# Patient Record
Sex: Male | Born: 1975 | Race: Black or African American | Hispanic: No | Marital: Single | Smoking: Current every day smoker
Health system: Southern US, Community
[De-identification: ages and names within clinical notes are randomized; demographics above are authoritative.]

## PROBLEM LIST (undated history)

## (undated) DIAGNOSIS — R011 Cardiac murmur, unspecified: Secondary | ICD-10-CM

## (undated) HISTORY — PX: OTHER SURGICAL HISTORY: SHX169

---

## 2000-01-12 ENCOUNTER — Emergency Department (HOSPITAL_COMMUNITY): Admission: EM | Admit: 2000-01-12 | Discharge: 2000-01-12 | Payer: Self-pay | Admitting: Emergency Medicine

## 2000-02-17 ENCOUNTER — Emergency Department (HOSPITAL_COMMUNITY): Admission: EM | Admit: 2000-02-17 | Discharge: 2000-02-17 | Payer: Self-pay | Admitting: Emergency Medicine

## 2000-03-23 ENCOUNTER — Emergency Department (HOSPITAL_COMMUNITY): Admission: EM | Admit: 2000-03-23 | Discharge: 2000-03-23 | Payer: Self-pay | Admitting: Emergency Medicine

## 2000-03-23 ENCOUNTER — Encounter: Payer: Self-pay | Admitting: Emergency Medicine

## 2000-09-23 ENCOUNTER — Emergency Department (HOSPITAL_COMMUNITY): Admission: EM | Admit: 2000-09-23 | Discharge: 2000-09-23 | Payer: Self-pay | Admitting: *Deleted

## 2000-09-30 ENCOUNTER — Emergency Department (HOSPITAL_COMMUNITY): Admission: EM | Admit: 2000-09-30 | Discharge: 2000-09-30 | Payer: Self-pay | Admitting: Emergency Medicine

## 2002-09-16 ENCOUNTER — Emergency Department (HOSPITAL_COMMUNITY): Admission: EM | Admit: 2002-09-16 | Discharge: 2002-09-16 | Payer: Self-pay | Admitting: Emergency Medicine

## 2002-09-16 ENCOUNTER — Encounter: Payer: Self-pay | Admitting: Emergency Medicine

## 2003-02-06 ENCOUNTER — Ambulatory Visit (HOSPITAL_BASED_OUTPATIENT_CLINIC_OR_DEPARTMENT_OTHER): Admission: RE | Admit: 2003-02-06 | Discharge: 2003-02-06 | Payer: Self-pay | Admitting: Orthopedic Surgery

## 2004-06-30 ENCOUNTER — Inpatient Hospital Stay (HOSPITAL_COMMUNITY): Admission: EM | Admit: 2004-06-30 | Discharge: 2004-07-02 | Payer: Self-pay | Admitting: Emergency Medicine

## 2004-06-30 ENCOUNTER — Ambulatory Visit: Payer: Self-pay | Admitting: Internal Medicine

## 2004-07-01 ENCOUNTER — Encounter (INDEPENDENT_AMBULATORY_CARE_PROVIDER_SITE_OTHER): Payer: Self-pay | Admitting: *Deleted

## 2004-07-02 ENCOUNTER — Ambulatory Visit: Payer: Self-pay | Admitting: Internal Medicine

## 2004-08-18 ENCOUNTER — Ambulatory Visit (HOSPITAL_COMMUNITY): Admission: RE | Admit: 2004-08-18 | Discharge: 2004-08-18 | Payer: Self-pay | Admitting: Family Medicine

## 2004-08-18 ENCOUNTER — Emergency Department (HOSPITAL_COMMUNITY): Admission: EM | Admit: 2004-08-18 | Discharge: 2004-08-18 | Payer: Self-pay | Admitting: Family Medicine

## 2004-12-14 ENCOUNTER — Emergency Department (HOSPITAL_COMMUNITY): Admission: EM | Admit: 2004-12-14 | Discharge: 2004-12-14 | Payer: Self-pay | Admitting: Family Medicine

## 2009-12-14 ENCOUNTER — Emergency Department (HOSPITAL_COMMUNITY): Admission: EM | Admit: 2009-12-14 | Discharge: 2009-12-14 | Payer: Self-pay | Admitting: Emergency Medicine

## 2010-06-11 NOTE — Procedures (Signed)
CONCLUSION:  This is a normal EEG awake and asleep.       UJ:[id number]  D:  07/01/2004 17:30:24  T:  07/01/2004 22:59:47  Job #:  [id number]   cc:   Ileana Roup, M.D.  [address], [place] [address]  Fax: [phone]
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
## 2014-10-11 ENCOUNTER — Emergency Department (HOSPITAL_COMMUNITY)
Admission: EM | Admit: 2014-10-11 | Discharge: 2014-10-11 | Disposition: A | Discharge status: Home / Self Care | Payer: Self-pay | Attending: Emergency Medicine | Admitting: Emergency Medicine

## 2014-10-11 ENCOUNTER — Encounter (HOSPITAL_COMMUNITY): Payer: Self-pay | Admitting: Nurse Practitioner

## 2014-10-11 DIAGNOSIS — W1830XA Fall on same level, unspecified, initial encounter: Secondary | ICD-10-CM | POA: Insufficient documentation

## 2014-10-11 DIAGNOSIS — Y999 Unspecified external cause status: Secondary | ICD-10-CM | POA: Insufficient documentation

## 2014-10-11 DIAGNOSIS — M436 Torticollis: Secondary | ICD-10-CM | POA: Insufficient documentation

## 2014-10-11 DIAGNOSIS — S4991XA Unspecified injury of right shoulder and upper arm, initial encounter: Secondary | ICD-10-CM | POA: Insufficient documentation

## 2014-10-11 DIAGNOSIS — Z720 Tobacco use: Secondary | ICD-10-CM | POA: Insufficient documentation

## 2014-10-11 DIAGNOSIS — Y9367 Activity, basketball: Secondary | ICD-10-CM | POA: Insufficient documentation

## 2014-10-11 DIAGNOSIS — Y92310 Basketball court as the place of occurrence of the external cause: Secondary | ICD-10-CM | POA: Insufficient documentation

## 2014-10-11 MED ORDER — NAPROXEN 500 MG PO TABS: 500.0000 mg | ORAL_TABLET | Freq: Two times a day (BID) | ORAL | Status: DC

## 2014-10-11 MED ORDER — METHOCARBAMOL 500 MG PO TABS: 1000.0000 mg | ORAL_TABLET | Freq: Four times a day (QID) | ORAL | Status: DC

## 2014-10-11 NOTE — ED Notes (Signed)
He fell onto ground while playing basketball on Tuesday. Hes had R sided neck and R shoulder pain, increasingly worse since. 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 Cms intact

## 2014-10-11 NOTE — Discharge Instructions (Signed)
Please read and follow all provided instructions.  Your diagnoses today include:  1. Torticollis, acute    Tests performed today include:  Vital signs. See below for your results today.   Medications prescribed:   Robaxin (methocarbamol) - muscle relaxer medication  DO NOT drive or perform any activities that require you to be awake and alert because this medicine can make you drowsy.    Naproxen - anti-inflammatory pain medication  Do not exceed  naproxen every 12 hours, take with food  You have been prescribed an anti-inflammatory medication or NSAID. Take with food. Take smallest effective dose for the shortest duration needed for your pain. Stop taking if you experience stomach pain or vomiting.   Take any prescribed medications only as directed.  Home care instructions:  Follow any educational materials contained in this packet.  BE VERY CAREFUL not to take multiple medicines containing Tylenol (also called acetaminophen). Doing so can lead to an overdose which can damage your liver and cause liver failure and possibly death.   Follow-up instructions: Please follow-up with your primary care provider in the next 3 days for further evaluation of your symptoms.   Return instructions:   Please return to the Emergency Department if you experience worsening symptoms.   Please return if you have any other emergent concerns.  Additional Information:  Your vital signs today were: BP 138/84 mmHg   Pulse 89   Temp(Src) 98.1 F (36.7 C) (Oral)   Resp 16   Ht  (1.88 m)   Wt 179 lb 4.8 oz (81.33 kg)   BMI 23.01 kg/m2   SpO2 98% If your blood pressure (BP) was elevated above 135/85 this visit, please have this repeated by your doctor within one month. --------------

## 2014-10-11 NOTE — ED Provider Notes (Signed)
CSN: [id number]     Arrival date & time 10/11/14  1148 History  This chart was scribed for Rhea Bleacher, working with Marily Memos, MD by Chestine Spore, ED Scribe. The patient was seen in room TR05C/TR05C at 12:06 PM.    Chief Complaint  Patient presents with  . Shoulder Pain     The history is provided by the patient. No language interpreter was used.    Gavin Bowen is a 39 y.o. male who presents to the Emergency Department complaining of worsening right neck pain onset 5 days. He notes that he was playing basketball 5 days ago when he fell onto the ground on his right side and had right sided neck pain initially. 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 He notes that he has not tried any medications for the relief of his symptoms. He denies color change, wound, rash, numbness, tingling, bowel/bladder incontinence, difficulty urinating, leg pain, gait problem, and any other symptoms.    History reviewed. No pertinent past medical history. History reviewed. No pertinent past surgical history. History reviewed. No pertinent family history. Social History  Substance Use Topics  . Smoking status: Current Every Day Smoker    Types: Cigarettes  . Smokeless tobacco: None  . Alcohol Use: No    Review of Systems  Gastrointestinal: Negative for diarrhea.       No bowel incontinence  Genitourinary: Negative for difficulty urinating.       No bladder incontinence  Musculoskeletal: Positive for neck pain. Negative for arthralgias and gait problem.  Skin: Negative for color change, rash and wound.  Neurological: Negative for weakness and numbness.       No tingling      Allergies  Review of patient's allergies indicates no known allergies.  Home Medications   Prior to Admission medications   Not on File   BP 138/84 mmHg  Pulse 89  Temp(Src) 98.1 F (36.7 C) (Oral)  Resp 16  Ht  (1.88 m)  Wt 179 lb 4.8 oz  (81.33 kg)  BMI 23.01 kg/m2  SpO2 98% Physical Exam  Constitutional: He appears well-developed and well-nourished.  HENT:  Head: Normocephalic and atraumatic.  Eyes: Conjunctivae are normal.  Neck: Neck supple. Muscular tenderness present. Decreased range of motion (rotation to right decreased) present.    Pulmonary/Chest: No respiratory distress.  Musculoskeletal:       Right shoulder: Normal. He exhibits normal range of motion, no tenderness and no bony tenderness.       Left shoulder: Normal.       Cervical back: He exhibits tenderness and spasm. He exhibits normal range of motion and no bony tenderness.       Back:  Neurological: He is alert.  Skin: Skin is warm and dry.  Psychiatric: He has a normal mood and affect.  Nursing note and vitals reviewed.   ED Course  Procedures (including critical care time) DIAGNOSTIC STUDIES: Oxygen Saturation is 98% on RA, nl by my interpretation.    COORDINATION OF CARE: 12:10 PM Discussed treatment plan with pt at bedside which includes aleve Rx, muscle relaxer Rx, rest, warm compresses, and pt agreed to plan.   Vital signs reviewed and are as follows: Filed Vitals:   10/11/14 1153  BP: 138/84  Pulse: 89  Temp: 98.1 F (36.7 C)  Resp: 16   Patient counseled on proper use of muscle relaxant medication.  They were told not to  drink alcohol, drive any vehicle, or do any dangerous activities while taking this medication.  Patient verbalized understanding.   MDM   Final diagnoses:  Torticollis, acute   Patient with trapezius spasm and torticollis after fall several days ago. Patient has full range of motion in shoulder and his right upper extremity. No neurological deficits in any extremity.  I personally performed the services described in this documentation, which was scribed in my presence. The recorded information has been reviewed and is accurate.    Renne Crigler, PA-C 10/11/14 1250  Marily Memos, MD 10/12/14 [phone]

## 2015-03-26 ENCOUNTER — Emergency Department (HOSPITAL_COMMUNITY)
Admission: EM | Admit: 2015-03-26 | Discharge: 2015-03-26 | Disposition: A | Discharge status: Home / Self Care | Payer: 59 | Attending: Emergency Medicine | Admitting: Emergency Medicine

## 2015-03-26 ENCOUNTER — Encounter (HOSPITAL_COMMUNITY): Payer: Self-pay | Admitting: Emergency Medicine

## 2015-03-26 DIAGNOSIS — S40861A Insect bite (nonvenomous) of right upper arm, initial encounter: Secondary | ICD-10-CM | POA: Diagnosis not present

## 2015-03-26 DIAGNOSIS — Y9389 Activity, other specified: Secondary | ICD-10-CM | POA: Insufficient documentation

## 2015-03-26 DIAGNOSIS — Z79899 Other long term (current) drug therapy: Secondary | ICD-10-CM | POA: Diagnosis not present

## 2015-03-26 DIAGNOSIS — Y998 Other external cause status: Secondary | ICD-10-CM | POA: Insufficient documentation

## 2015-03-26 DIAGNOSIS — Y9289 Other specified places as the place of occurrence of the external cause: Secondary | ICD-10-CM | POA: Diagnosis not present

## 2015-03-26 DIAGNOSIS — F17210 Nicotine dependence, cigarettes, uncomplicated: Secondary | ICD-10-CM | POA: Diagnosis not present

## 2015-03-26 DIAGNOSIS — S90561A Insect bite (nonvenomous), right ankle, initial encounter: Secondary | ICD-10-CM | POA: Insufficient documentation

## 2015-03-26 DIAGNOSIS — Z791 Long term (current) use of non-steroidal anti-inflammatories (NSAID): Secondary | ICD-10-CM | POA: Diagnosis not present

## 2015-03-26 DIAGNOSIS — W57XXXA Bitten or stung by nonvenomous insect and other nonvenomous arthropods, initial encounter: Secondary | ICD-10-CM | POA: Diagnosis not present

## 2015-03-26 MED ORDER — DIPHENHYDRAMINE HCL 25 MG PO TABS: 25.0000 mg | ORAL_TABLET | Freq: Four times a day (QID) | ORAL | Status: DC

## 2015-03-26 MED ORDER — RANITIDINE HCL 150 MG PO CAPS: 150.0000 mg | ORAL_CAPSULE | Freq: Every day | ORAL | Status: DC

## 2015-03-26 MED ORDER — HYDROCORTISONE 1 % EX CREA: TOPICAL_CREAM | CUTANEOUS | Status: DC

## 2015-03-26 NOTE — ED Provider Notes (Signed)
CSN: [id number]     Arrival date & time 03/26/15  2006 History  By signing my name below, I, Gavin Bowen, attest that this documentation has been prepared under the direction and in the presence of Gavin Mills, PA-C. Electronically Signed: Randell Bowen, ED Scribe. 03/26/2015. 9:15 PM.   Chief Complaint  Bowen presents with  . Insect Bite   The history is provided by the Bowen. No language interpreter was used.   HPI Comments: Gavin Bowen is a 40 y.o. male who presents to the Emergency Department complaining of sore, pruritic, bumps to his right ankle and right arm onset 2 days ago. Bowen reports that he fell asleep on a couch in a house with roaches with gradual onset of itching the following morning and gradual swelling bumps while at work yesterday. He has not tried any treatments. He denies ankle pain, cough, nausea, vomiting, abdominal pain, and fever. No other medical complaints  History reviewed. No pertinent past medical history. History reviewed. No pertinent past surgical history. History reviewed. No pertinent family history. Social History  Substance Use Topics  . Smoking status: Current Every Day Smoker    Types: Cigarettes  . Smokeless tobacco: None  . Alcohol Use: No    Review of Systems A complete 10 system review of systems was obtained and all systems are negative except as noted in the HPI and PMH.    Allergies  Review of Bowen's allergies indicates no known allergies.  Home Medications   Prior to Admission medications   Medication Sig Start Date End Date Taking? Authorizing Provider  diphenhydrAMINE (BENADRYL) 25 MG tablet Take 1 tablet (25 mg total) by mouth every 6 (six) hours. 03/26/15   Gavin Peek, PA-C  hydrocortisone cream 1 % Apply to affected area 2 times daily 03/26/15   Gavin Peek, PA-C  methocarbamol (ROBAXIN) 500 MG tablet Take 2 tablets (1,000 mg total) by mouth 4 (four) times daily. 10/11/14   Gavin Crigler,  PA-C  naproxen (NAPROSYN) 500 MG tablet Take 1 tablet (500 mg total) by mouth 2 (two) times daily. 10/11/14   Gavin Crigler, PA-C  ranitidine (ZANTAC) 150 MG capsule Take 1 capsule (150 mg total) by mouth daily. 03/26/15   Gavin Doucet, PA-C   BP 152/90 mmHg  Pulse 89  Temp(Src) 97.9 F (36.6 C) (Oral)  Resp 18  SpO2 98% Physical Exam  Constitutional: He is oriented to person, place, and time. He appears well-developed and well-nourished. No distress.  HENT:  Head: Normocephalic and atraumatic.  Eyes: Conjunctivae and EOM are normal.  Neck: Neck supple. No tracheal deviation present.  Cardiovascular: Normal rate, regular rhythm and normal heart sounds.   Heart sounds nml. RR/HR.  Pulmonary/Chest: Effort normal. No respiratory distress.  Abdominal: Soft. There is no tenderness.  Abdomen soft and nontender.  Musculoskeletal: Normal range of motion.  Neurological: He is alert and oriented to person, place, and time.  Skin: Skin is warm and dry. Rash noted. Rash is macular and vesicular.  Multiple small erythematous maculopapular lesions over her ankles and upper extremities with one vesicular lesion over the anterior right ankle. No overt warmth.  Psychiatric: He has a normal mood and affect. His behavior is normal.  Nursing note and vitals reviewed.   ED Course  Procedures   DIAGNOSTIC STUDIES: Oxygen Saturation is 98% on RA, normal by my interpretation.    COORDINATION OF CARE: 8:55 PM Will prescribe hydrocortisone cream, Benadryl, and Zantac. Discussed treatment plan with pt at bedside and pt  agreed to plan.   Labs Review Labs Reviewed - No data to display  Imaging Review No results found. I have personally reviewed and evaluated these images and lab results as part of my medical decision-making.   EKG Interpretation None      MDM  Bowen with rash consistent with exposure to bed bugs. Not consistent with cellulitis. No signs of anaphylactic reaction; no new  medications. Will treat with hydrocodone cream, Benadryl and Zantac,. Follow up with PCP in 2-3 days. Return precautions discussed. Pt is safe for discharge at this time.  Final diagnoses:  Insect bite   Filed Vitals:   03/26/15 2043  BP: 152/90  Pulse: 89  Temp: 97.9 F (36.6 C)  TempSrc: Oral  Resp: 18  SpO2: 98%   Meds given in ED:  Medications - No data to display  Discharge Medication List as of 03/26/2015  9:08 PM    START taking these medications   Details  diphenhydrAMINE (BENADRYL) 25 MG tablet Take 1 tablet (25 mg total) by mouth every 6 (six) hours., Starting 03/26/2015, Until Discontinued, Print    hydrocortisone cream 1 % Apply to affected area 2 times daily, Print    ranitidine (ZANTAC) 150 MG capsule Take 1 capsule (150 mg total) by mouth daily., Starting 03/26/2015, Until Discontinued, Print          I personally performed the services described in this documentation, which was scribed in my presence. The recorded information has been reviewed and is accurate.    Gavin Peek, PA-C 03/26/15 2119  Gavin Sou, MD 03/27/15 [phone]

## 2015-03-26 NOTE — Discharge Instructions (Signed)
Taking medications as prescribed. Follow-up with your doctor as needed. Return to ED for new or worsening symptoms. Follow instructions on attached information sheet to help remove bugs from your living area. If the bugs are not removed, your symptoms will persist.

## 2015-03-26 NOTE — ED Notes (Signed)
Pt states about 2 days ago noticed bits to right ankle. Pt does not know what he was bite by. Pt has multiple red bites to ankle and one has a blister.

## 2015-07-18 ENCOUNTER — Encounter (HOSPITAL_COMMUNITY): Payer: Self-pay

## 2015-07-18 ENCOUNTER — Other Ambulatory Visit: Payer: Self-pay

## 2015-07-18 ENCOUNTER — Emergency Department (HOSPITAL_COMMUNITY)
Admission: EM | Admit: 2015-07-18 | Discharge: 2015-07-18 | Disposition: A | Discharge status: Home / Self Care | Payer: 59 | Attending: Emergency Medicine | Admitting: Emergency Medicine

## 2015-07-18 DIAGNOSIS — R443 Hallucinations, unspecified: Secondary | ICD-10-CM | POA: Diagnosis present

## 2015-07-18 DIAGNOSIS — F17210 Nicotine dependence, cigarettes, uncomplicated: Secondary | ICD-10-CM | POA: Insufficient documentation

## 2015-07-18 DIAGNOSIS — R000 Tachycardia, unspecified: Secondary | ICD-10-CM

## 2015-07-18 DIAGNOSIS — Z79899 Other long term (current) drug therapy: Secondary | ICD-10-CM | POA: Diagnosis not present

## 2015-07-18 LAB — CBC
HCT: 40.3 % (ref 39.0–52.0)
Hemoglobin: 14.8 g/dL (ref 13.0–17.0)
MCH: 31.4 pg (ref 26.0–34.0)
MCHC: 36.7 g/dL — ABNORMAL HIGH (ref 30.0–36.0)
MCV: 85.6 fL (ref 78.0–100.0)
Platelets: 314 K/uL (ref 150–400)
RBC: 4.71 MIL/uL (ref 4.22–5.81)
RDW: 12.7 % (ref 11.5–15.5)
WBC: 14.3 K/uL — ABNORMAL HIGH (ref 4.0–10.5)

## 2015-07-18 LAB — ETHANOL: Alcohol, Ethyl (B): <5 mg/dL

## 2015-07-18 LAB — COMPREHENSIVE METABOLIC PANEL
ALT: 30 U/L (ref 17–63)
ANION GAP: 9 (ref 5–15)
AST: 28 U/L (ref 15–41)
Albumin: 4.3 g/dL (ref 3.5–5.0)
Alkaline Phosphatase: 63 U/L (ref 38–126)
BILIRUBIN TOTAL: 1.4 mg/dL — AB (ref 0.3–1.2)
BUN: 6 mg/dL (ref 6–20)
CO2: 27 mmol/L (ref 22–32)
Calcium: 9.8 mg/dL (ref 8.9–10.3)
Chloride: 101 mmol/L (ref 101–111)
Creatinine, Ser: 1.13 mg/dL (ref 0.61–1.24)
Glucose, Bld: 140 mg/dL — ABNORMAL HIGH (ref 65–99)
POTASSIUM: 3.1 mmol/L — AB (ref 3.5–5.1)
Sodium: 137 mmol/L (ref 135–145)
TOTAL PROTEIN: 8.1 g/dL (ref 6.5–8.1)

## 2015-07-18 LAB — RAPID URINE DRUG SCREEN, HOSP PERFORMED
Amphetamines: NOT DETECTED
Barbiturates: NOT DETECTED
Benzodiazepines: NOT DETECTED
Cocaine: POSITIVE — AB
Opiates: POSITIVE — AB
Tetrahydrocannabinol: POSITIVE — AB

## 2015-07-18 MED ORDER — LORAZEPAM 2 MG/ML IJ SOLN: 1.0000 mg | Freq: Once | INTRAMUSCULAR | Status: DC

## 2015-07-18 MED ORDER — SODIUM CHLORIDE 0.9 % IV BOLUS (SEPSIS): 2000.0000 mL | Freq: Once | INTRAVENOUS | Status: DC

## 2015-07-18 NOTE — ED Notes (Signed)
Pt states that he does not want any visitors at this time.

## 2015-07-18 NOTE — Discharge Instructions (Signed)
RETURN HERE IF YOU START HAVING ANY SYMPTOMS THAT ARE CONCERNING. PUSH FLUIDS, GET PLENTY OF REST, MAINTAIN A HEALTHY DIET.

## 2015-07-18 NOTE — ED Notes (Signed)
Pt admits to using "molly" also

## 2015-07-18 NOTE — ED Provider Notes (Signed)
CSN: [id number]     Arrival date & time 07/18/15  2109 History   First MD Initiated Contact with Patient 07/18/15 2209     Chief Complaint  Patient presents with  . Hallucinations  . Anxiety     (Consider location/radiation/quality/duration/timing/severity/associated sxs/prior Treatment) HPI Comments: Patient with no significant medical history presents with complaint of visual and tactile hallucinations since this morning. He reports being out with friends last night to celebrate his birthday and admits to smoking marijuana, drinking alcohol, and doing "molly". Last use this morning. Since shortly after smoking this morning he reports bugs crawling on his skin that he could feel and see. This has persisted throughout the day. No vomiting, pain, SOB, fever, sweating.   The history is provided by the patient. No language interpreter was used.    History reviewed. No pertinent past medical history. History reviewed. No pertinent past surgical history. No family history on file. Social History  Substance Use Topics  . Smoking status: Current Every Day Smoker    Types: Cigarettes  . Smokeless tobacco: None  . Alcohol Use: No    Review of Systems  Constitutional: Negative for fever and chills.  HENT: Negative.   Respiratory: Negative.   Cardiovascular: Negative.   Gastrointestinal: Negative.   Musculoskeletal: Negative.   Skin: Negative.   Neurological: Negative.   Psychiatric/Behavioral: Positive for hallucinations.      Allergies  Review of patient's allergies indicates no known allergies.  Home Medications   Prior to Admission medications   Medication Sig Start Date End Date Taking? Authorizing Provider  diphenhydrAMINE (BENADRYL) 25 MG tablet Take 1 tablet (25 mg total) by mouth every 6 (six) hours. 03/26/15   Joycie PeekBenjamin Cartner, PA-C  hydrocortisone cream 1 % Apply to affected area 2 times daily 03/26/15   Joycie PeekBenjamin Cartner, PA-C  methocarbamol (ROBAXIN) 500 MG tablet Take  2 tablets (1,000 mg total) by mouth 4 (four) times daily. 10/11/14   Renne CriglerJoshua Geiple, PA-C  naproxen (NAPROSYN) 500 MG tablet Take 1 tablet (500 mg total) by mouth 2 (two) times daily. 10/11/14   Renne CriglerJoshua Geiple, PA-C  ranitidine (ZANTAC) 150 MG capsule Take 1 capsule (150 mg total) by mouth daily. 03/26/15   Benjamin Cartner, PA-C   BP 166/104 mmHg  Pulse 111  Temp(Src) 99.3 F (37.4 C) (Oral)  Resp 12  SpO2 99% Physical Exam  Constitutional: He is oriented to person, place, and time. He appears well-developed and well-nourished.  HENT:  Head: Normocephalic.  Persistent jaw grinding.  Neck: Normal range of motion. Neck supple.  Cardiovascular: Regular rhythm.  Tachycardia present.   Pulmonary/Chest: Effort normal and breath sounds normal. He has no wheezes. He has no rales. He exhibits no tenderness.  Abdominal: Soft. Bowel sounds are normal. There is no tenderness. There is no rebound and no guarding.  Musculoskeletal: Normal range of motion.  Neurological: He is alert and oriented to person, place, and time.  Skin: Skin is warm and dry. No rash noted.  No rash, erythema, swelling to skin.  Psychiatric: He has a normal mood and affect.    ED Course  Procedures (including critical care time) Labs Review Labs Reviewed  COMPREHENSIVE METABOLIC PANEL - Abnormal; Notable for the following:    Potassium 3.1 (*)    Glucose, Bld 140 (*)    Total Bilirubin 1.4 (*)    All other components within normal limits  CBC - Abnormal; Notable for the following:    WBC 14.3 (*)    MCHC 36.7 (*)  All other components within normal limits  URINE RAPID DRUG SCREEN, HOSP PERFORMED - Abnormal; Notable for the following:    Opiates POSITIVE (*)    Cocaine POSITIVE (*)    Tetrahydrocannabinol POSITIVE (*)    All other components within normal limits  ETHANOL   Results for orders placed or performed during the hospital encounter of 07/18/15  Comprehensive metabolic panel  Result Value Ref Range    Sodium 137 135 - 145 mmol/L   Potassium 3.1 (L) 3.5 - 5.1 mmol/L   Chloride 101 101 - 111 mmol/L   CO2 27 22 - 32 mmol/L   Glucose, Bld 140 (H) 65 - 99 mg/dL   BUN 6 6 - 20 mg/dL   Creatinine, Ser 0.451.13 0.61 - 1.24 mg/dL   Calcium 9.8 8.9 - 40.910.3 mg/dL   Total Protein 8.1 6.5 - 8.1 g/dL   Albumin 4.3 3.5 - 5.0 g/dL   AST 28 15 - 41 U/L   ALT 30 17 - 63 U/L   Alkaline Phosphatase 63 38 - 126 U/L   Total Bilirubin 1.4 (H) 0.3 - 1.2 mg/dL   GFR calc non Af Amer >60 >60 mL/min   GFR calc Af Amer >60 >60 mL/min   Anion gap 9 5 - 15  Ethanol  Result Value Ref Range   Alcohol, Ethyl (B) <5 <5 mg/dL  cbc  Result Value Ref Range   WBC 14.3 (H) 4.0 - 10.5 K/uL   RBC 4.71 4.22 - 5.81 MIL/uL   Hemoglobin 14.8 13.0 - 17.0 g/dL   HCT 81.140.3 91.439.0 - 78.252.0 %   MCV 85.6 78.0 - 100.0 fL   MCH 31.4 26.0 - 34.0 pg   MCHC 36.7 (H) 30.0 - 36.0 g/dL   RDW 95.612.7 21.311.5 - 08.615.5 %   Platelets 314 150 - 400 K/uL  Rapid urine drug screen (hospital performed)  Result Value Ref Range   Opiates POSITIVE (A) NONE DETECTED   Cocaine POSITIVE (A) NONE DETECTED   Benzodiazepines NONE DETECTED NONE DETECTED   Amphetamines NONE DETECTED NONE DETECTED   Tetrahydrocannabinol POSITIVE (A) NONE DETECTED   Barbiturates NONE DETECTED NONE DETECTED    Imaging Review No results found. I have personally reviewed and evaluated these images and lab results as part of my medical decision-making.   EKG Interpretation   Date/Time:  Saturday July 18 2015 22:07:56 EDT Ventricular Rate:  105 PR Interval:    QRS Duration: 136 QT Interval:  370 QTC Calculation: 489 R Axis:   64 Text Interpretation:  Sinus tachycardia Left bundle branch block rapid  rate but no significant change from 2006 Confirmed by ZACKOWSKI  MD, SCOTT  [phone]([id number]) on 07/18/2015 10:12:44 PM      MDM   Final diagnoses:  None    1. Tachycardia 2. Tactile hallucinations  Patient presents with visual and tactile hallucinations ("bugs crawling on my  skin") starting after using multiple substances during his birthday celebration which ended this morning. IVF's ordered with ATivan. No SI/HI.  On IV start nurse notes tachycardia resolved. Patient re-evaluated. He is feeling much better. VSS. He is no longer hallucinating. He states he is comfortable going home. Discussed return precautions.     Elpidio AnisShari Alano Blasco, PA-C 07/18/15 2316  Vanetta MuldersScott Zackowski, MD 07/22/15 [phone]

## 2015-07-18 NOTE — ED Notes (Addendum)
Patient complains of using marijuana, etoh last night at party and since that time has been seeing bugs crawling on him, appears anxious. Denies pain. Alert to person, place, time.

## 2015-07-20 ENCOUNTER — Ambulatory Visit (INDEPENDENT_AMBULATORY_CARE_PROVIDER_SITE_OTHER): Payer: 59 | Admitting: Family Medicine

## 2015-07-20 ENCOUNTER — Ambulatory Visit (INDEPENDENT_AMBULATORY_CARE_PROVIDER_SITE_OTHER): Payer: 59

## 2015-07-20 VITALS — BP 126/82 | HR 90 | Temp 97.7°F | Resp 16 | Ht 74.0 in | Wt 202.8 lb

## 2015-07-20 DIAGNOSIS — M79672 Pain in left foot: Secondary | ICD-10-CM | POA: Diagnosis not present

## 2015-07-20 DIAGNOSIS — S9032XA Contusion of left foot, initial encounter: Secondary | ICD-10-CM | POA: Diagnosis not present

## 2015-07-20 DIAGNOSIS — Y990 Civilian activity done for income or pay: Secondary | ICD-10-CM | POA: Diagnosis not present

## 2015-07-20 DIAGNOSIS — S63502A Unspecified sprain of left wrist, initial encounter: Secondary | ICD-10-CM

## 2015-07-20 NOTE — Progress Notes (Signed)
Patient ID: Gavin Bowen, male    DOB: 11/20/1975  Age: 40 y.o. MRN: [id number]  Chief Complaint  Patient presents with  . Foot Injury    tank rolled over his foot, hard to walk on it, x friday  . 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 He was rolling a barrel to the garage on Friday and lost control a little bit, tried to return it to stop it and it rolled over his left foot. In the process somehow he also strained his left wrist. It was at the end of the shift and he was wanting to get off because it was his birthday weekend, so he did not report it at that time. It continued to hurt a lot through the weekend and this morning he could not walk on the left foot because of the pain on the outer side of the foot so he came on in here. He will need a work excuse for today.  Current allergies, medications, problem list, past/family and social histories reviewed.  Objective:  BP 126/82 mmHg  Pulse 90  Temp(Src) 97.7 F (36.5 C) (Oral)  Resp 16  Ht 6\' 2"  (1.88 m)  Wt 202 lb 12.8 oz (91.989 kg)  BMI 26.03 kg/m2  SpO2 99%  Healthy-appearing young man. Left wrist is not swollen or bruised. It is not tender but he has pain on full flexion of the wrist.  The left foot also does not appear deformed or ecchymotic. He is nontender in the ankle. There is tenderness over the fourth metatarsal region of the foot. The second, third, and fifth metatarsals do not seem tender. Motion of the fourth toe also causes pain. The tenderness is from midfoot down.  Assessment & Plan:   Assessment: 1. Work related injury   2. Left foot pain   3. Foot contusion, left, initial encounter   4. Left wrist sprain, initial encounter       Plan: Suspicious for fracture of the fourth metatarsal, will get x-ray. The wrist is just mildly sprained and I don't think that will be a big issue. It does not seem to require x-rays at this time.  Orders  Placed This Encounter  Procedures  . DG Foot Complete Left    Order Specific Question:  Reason for Exam (SYMPTOM  OR DIAGNOSIS REQUIRED)    Answer:  pain from work injury    Order Specific Question:  Preferred imaging location?    Answer:  External    No orders of the defined types were placed in this encounter.   Radiologist does not see any fracture in the area of concern. However I note that at the distal fourth metatarsal there is a faintly visible line on a single view. That is exactly where he is painful, and although not enough to call a fracture it is possible he has an early hairline deformity.      Patient Instructions   Minimize weightbearing on the left foot. Wear the firm sole and use crutches.  If the job place has a light duty you can qualify for, I can change my instructions. In the meanwhile I am stating that you should be off work for the next week and return for recheck and re-x-ray.  The wrist should be fine. I do not believe further evaluation and treatment is needed.  Ibuprofen over-the-counter 600-800 mg 3 times daily as needed for pain  Applying ice for about 15 minutes for 5 times a day can give some relief also.    IF you received an x-ray today, you will receive an invoice [facility]. Please contact [facility] at [phone] with questions or concerns regarding your invoice.   IF you received labwork today, you will receive an invoice from [facility]. Please contact [facility] at [phone] with questions or concerns regarding your invoice.   Our billing staff will not be able to assist you with questions regarding bills from these companies.  You will be contacted with the lab results as soon as they are available. The fastest way to get your results is to activate your My Chart account. Instructions are located on the last page of this paperwork. If you have not heard from us regarding the results  in 2 weeks, please contact this office.          Return in about 1 week (around 07/27/2015).   Thad Osoria, MD 07/20/2015

## 2015-07-20 NOTE — Patient Instructions (Addendum)
Minimize weightbearing on the left foot. Wear the firm sole and use crutches.  If the job place has a light duty you can qualify for, I can change my instructions. In the meanwhile I am stating that you should be off work for the next week and return for recheck and re-x-ray.  The wrist should be fine. I do not believe further evaluation and treatment is needed.  Ibuprofen over-the-counter 600-800 mg 3 times daily as needed for pain  Applying ice for about 15 minutes for 5 times a day can give some relief also.    IF you received an x-ray today, you will receive an invoice from [facility]. Please contact [facility] at [phone] with questions or concerns regarding your invoice.   IF you received labwork today, you will receive an invoice from [facility]. Please contact [facility] at [phone] with questions or concerns regarding your invoice.   Our billing staff will not be able to assist you with questions regarding bills from these companies.  You will be contacted with the lab results as soon as they are available. The fastest way to get your results is to activate your My Chart account. Instructions are located on the last page of this paperwork. If you have not heard from us regarding the results in 2 weeks, please contact this office.

## 2015-07-27 ENCOUNTER — Ambulatory Visit (INDEPENDENT_AMBULATORY_CARE_PROVIDER_SITE_OTHER): Payer: 59 | Admitting: Family Medicine

## 2015-07-27 ENCOUNTER — Ambulatory Visit (INDEPENDENT_AMBULATORY_CARE_PROVIDER_SITE_OTHER): Payer: 59

## 2015-07-27 VITALS — BP 138/90 | HR 86 | Temp 98.9°F | Resp 16 | Ht 74.0 in | Wt 200.0 lb

## 2015-07-27 DIAGNOSIS — S99922D Unspecified injury of left foot, subsequent encounter: Secondary | ICD-10-CM

## 2015-07-27 DIAGNOSIS — S9032XD Contusion of left foot, subsequent encounter: Secondary | ICD-10-CM | POA: Diagnosis not present

## 2015-07-27 DIAGNOSIS — S63502D Unspecified sprain of left wrist, subsequent encounter: Secondary | ICD-10-CM

## 2015-07-27 NOTE — Progress Notes (Signed)
Subjective:    Patient ID: Gavin Bowen, male    DOB: 04-21-1975, 40 y.o.   MRN: [id number] By signing my name below, I, Gavin Bowen, attest that this documentation has been prepared under the direction and in the presence of Gavin SorensonEva Carin Shipp, MD. Electronically Signed: Javier Dockerobert Ryan Bowen, ER Scribe. 07/27/2015. 6:33 PM.  Chief Complaint  Patient presents with  . Follow-up    w/c. Pt wants referral to orthopedic.     HPI HPI Comments: Gavin Bowen is a 40 y.o. male who [facility] for a follow up appointment from an injury on 07/17/2015. He was initially by my partner on 07/20/2015 after a large paint can rolled over his foot which left him unable to walk. He states today his foot is not any better, and he continues to be unable to walk on it.  He requests to be referred to orthopedics since his foot xray was unrevealing other than a slight possibility of an early hairline deformity at his distal 4th metatarsal and his sxs have not improved with management of post-op shoe and recommendation of minimal weight-bearing with crutches. His wrist has hurt him to much to be able to use the crutches.   No past medical history on file.  No Known Allergies  Current Outpatient Prescriptions on File Prior to Visit  Medication Sig Dispense Refill  . methocarbamol (ROBAXIN) 500 MG tablet Take 2 tablets (1,000 mg total) by mouth 4 (four) times daily. (Patient not taking: Reported on 07/20/2015) 30 tablet 0  . ranitidine (ZANTAC) 150 MG capsule Take 1 capsule (150 mg total) by mouth daily. (Patient not taking: Reported on 07/20/2015) 30 capsule 0   No current facility-administered medications on file prior to visit.    Review of Systems  Constitutional: Negative for fever and chills.  Musculoskeletal: Positive for arthralgias and gait problem.  Skin: Negative for wound.  Neurological: Positive for weakness (Secondary to pain). Negative for numbness.      Objective:  BP 138/90  mmHg  Pulse 86  Temp(Src) 98.9 F (37.2 C) (Oral)  Resp 16  Ht 6\' 2"  (1.88 m)  Wt 200 lb (90.719 kg)  BMI 25.67 kg/m2  SpO2 99%  Physical Exam  Constitutional: He is oriented to person, place, and time. He appears well-developed and well-nourished. No distress.  HENT:  Head: Normocephalic and atraumatic.  Eyes: Pupils are equal, round, and reactive to light.  Neck: Neck supple.  Cardiovascular: Normal rate.   Pulmonary/Chest: Effort normal. No respiratory distress.  Musculoskeletal:       Left wrist: He exhibits decreased range of motion. He exhibits no tenderness, no bony tenderness, no swelling, no effusion, no crepitus, no deformity and no laceration.       Left foot: There is decreased range of motion, tenderness and bony tenderness. There is normal capillary refill, no crepitus, no deformity and no laceration.       Feet:  Severely ttp at distal left 3rd and 4th metatarsals Left wrist without bony tenderness including at radial and ulnar prominences and anatomic snuffbox.  Neurological: He is alert and oriented to person, place, and time. Coordination normal.  Skin: Skin is warm and dry. He is not diaphoretic.  Psychiatric: He has a normal mood and affect. His behavior is normal.  Nursing note and vitals reviewed.    Dg Foot Complete Left  07/27/2015  CLINICAL DATA:  Pain following injury EXAM: LEFT FOOT - COMPLETE 3+ VIEW COMPARISON:  July 20, 2015 FINDINGS: Frontal, oblique, and lateral views were obtained. There is a stable focus of ossification immediately medial to the distal aspect of the third distal phalanx. This finding potentially may represent a prior avulsion type injury. There is no acute appearing fracture or dislocation. There is hallux valgus deformity at the first MTP joint with early bunion formation. There is no appreciable joint space narrowing. No erosive change. IMPRESSION: Small area of ossification immediately medial to the distal aspect of the third distal  phalanx, stable. No acute fracture or dislocation evident. Joint spaces appear unremarkable. There is mild hallux valgus deformity with early bunion formation at the first MTP joint, stable. Electronically Signed   By: Bretta BangWilliam  Woodruff III M.D.   On: 07/27/2015 18:51   Dg Foot Complete Left  07/20/2015  CLINICAL DATA:  Left foot injury at work. Left foot pain. Initial encounter. EXAM: LEFT FOOT - COMPLETE 3+ VIEW COMPARISON:  None. FINDINGS: Tiny ossific density seen along the medial aspect of the distal phalanx of the middle toe. This is of indeterminate age and could represent an acute or chronic fracture fragment. No other fractures identified. No evidence of dislocation. No other bone lesions identified. IMPRESSION: Tiny ossific density adjacent to distal phalanx of middle toe. This is of indeterminate age radiographically, and could represent a tiny acute or chronic fracture fragment. Recommend clinical correlation for point tenderness at this site. Electronically Signed   By: Myles RosenthalJohn  Stahl M.D.   On: 07/20/2015 11:52    Assessment & Plan:   1. Foot injury, left, subsequent encounter   2. Wrist sprain, left, subsequent encounter   Worker's comp case from inj on Fri July 17, 2015.  Pt has been wearing post-op shoe on left and using crutches as needed (he brought his crutches to the office today but walked throughout the office just holding them in the air.)  He is still very tender over distal 4th metatarsal where there was a concern for poss early hairline deformity but repeat xray today does not confirm so will refer to ortho for further eval - see if pt needs additional imaging to eval for stress fracture.  Placed in thumb spica brace on left wrist - needs imaging but exam today reassuring for no point tenderness over carpel bones so will defer to ortho.  Recheck in 1 wk unless he has ortho appt by then. Ok to to RTW light duty - needs a position where he can sit for most of the day.  Orders  Placed This Encounter  Procedures  . DG Foot Complete Left    Standing Status: Future     Number of Occurrences: 1     Standing Expiration Date: 07/26/2016    Order Specific Question:  Reason for Exam (SYMPTOM  OR DIAGNOSIS REQUIRED)    Answer:  follow up possible fracture    Order Specific Question:  Preferred imaging location?    Answer:  External     I personally performed the services described in this documentation, which was scribed in my presence. The recorded information has been reviewed and considered, and addended by me as needed.   Gavin SorensonEva Shermaine Brigham, M.D.  [facility]  [place] [address] [place], [place] [address] [phone] phone [phone] fax  08/03/2015 2:38 PM

## 2015-07-27 NOTE — Patient Instructions (Addendum)
   IF you received an x-ray today, you will receive an invoice from [facility]. Please contact [facility] at [phone] with questions or concerns regarding your invoice.   IF you received labwork today, you will receive an invoice from [facility]. Please contact [facility] at [phone] with questions or concerns regarding your invoice.   Our billing staff will not be able to assist you with questions regarding bills from these companies.  You will be contacted with the lab results as soon as they are available. The fastest way to get your results is to activate your My Chart account. Instructions are located on the last page of this paperwork. If you have not heard from us regarding the results in 2 weeks, please contact this office.      Wrist Sprain With Rehab A sprain is an injury in which a ligament that maintains the proper alignment of a joint is partially or completely torn. The ligaments of the wrist are susceptible to sprains. Sprains are classified into three categories. Grade 1 sprains cause pain, but the tendon is not lengthened. Grade 2 sprains include a lengthened ligament because the ligament is stretched or partially ruptured. With grade 2 sprains there is still function, although the function may be diminished. Grade 3 sprains are characterized by a complete tear of the tendon or muscle, and function is usually impaired. SYMPTOMS   Pain tenderness, inflammation, and/or bruising (contusion) of the injury.  A "pop" or tear felt and/or heard at the time of injury.  Decreased wrist function. CAUSES  A wrist sprain occurs when a force is placed on one or more ligaments that is greater than it/they can withstand. Common mechanisms of injury include:  Catching a ball with your hands.  Repetitive and/ or strenuous extension or flexion of the wrist. RISK INCREASES WITH:  Previous wrist injury.  Contact sports (boxing or  wrestling).  Activities in which falling is common.  Poor strength and flexibility.  Improperly fitted or padded protective equipment. PREVENTION  Warm up and stretch properly before activity.  Allow for adequate recovery between workouts.  Maintain physical fitness:  Strength, flexibility, and endurance.  Cardiovascular fitness.  Protect the wrist joint by limiting its motion with the use of taping, braces, or splints.  Protect the wrist after injury for 6 to 12 months. PROGNOSIS  The prognosis for wrist sprains depends on the degree of injury. Grade 1 sprains require 2 to 6 weeks of treatment. Grade 2 sprains require 6 to 8 weeks of treatment, and grade 3 sprains require up to 12 weeks.  RELATED COMPLICATIONS   Prolonged healing time, if improperly treated or re-injured.  Recurrent symptoms that result in a chronic problem.  Injury to nearby structures (bone, cartilage, nerves, or tendons).  Arthritis of the wrist.  Inability to compete in athletics at a high level.  Wrist stiffness or weakness.  Progression to a complete rupture of the ligament. TREATMENT  Treatment initially involves resting from any activities that aggravate the symptoms, and the use of ice and medications to help reduce pain and inflammation. Your caregiver may recommend immobilizing the wrist for a period of time in order to reduce stress on the ligament and allow for healing. After immobilization it is important to perform strengthening and stretching exercises to help regain strength and a full range of motion. These exercises may be completed at home or with a therapist. Surgery is not usually required for wrist sprains, unless the ligament has   been ruptured (grade 3 sprain). MEDICATION   If pain medication is necessary, then nonsteroidal anti-inflammatory medications, such as aspirin and ibuprofen, or other minor pain relievers, such as acetaminophen, are often recommended.  Do not take pain  medication for 7 days before surgery.  Prescription pain relievers may be given if deemed necessary by your caregiver. Use only as directed and only as much as you need. HEAT AND COLD  Cold treatment (icing) relieves pain and reduces inflammation. Cold treatment should be applied for 10 to 15 minutes every 2 to 3 hours for inflammation and pain and immediately after any activity that aggravates your symptoms. Use ice packs or massage the area with a piece of ice (ice massage).  Heat treatment may be used prior to performing the stretching and strengthening activities prescribed by your caregiver, physical therapist, or athletic trainer. Use a heat pack or soak your injury in warm water. SEEK MEDICAL CARE IF:  Treatment seems to offer no benefit, or the condition worsens.  Any medications produce adverse side effects. EXERCISES RANGE OF MOTION (ROM) AND STRETCHING EXERCISES - Wrist Sprain  These exercises may help you when beginning to rehabilitate your injury. Your symptoms may resolve with or without further involvement from your physician, physical therapist or athletic trainer. While completing these exercises, remember:   Restoring tissue flexibility helps normal motion to return to the joints. This allows healthier, less painful movement and activity.  An effective stretch should be held for at least 30 seconds.  A stretch should never be painful. You should only feel a gentle lengthening or release in the stretched tissue. RANGE OF MOTION - Wrist Flexion, Active-Assisted  Extend your right / left elbow with your fingers pointing down.*  Gently pull the back of your hand towards you until you feel a gentle stretch on the top of your forearm.  Hold this position for __________ seconds. Repeat __________ times. Complete this exercise __________ times per day.  *If directed by your physician, physical therapist or athletic trainer, complete this stretch with your elbow bent rather  than extended. RANGE OF MOTION - Wrist Extension, Active-Assisted  Extend your right / left elbow and turn your palm upwards.*  Gently pull your palm/fingertips back so your wrist extends and your fingers point more toward the ground.  You should feel a gentle stretch on the inside of your forearm.  Hold this position for __________ seconds. Repeat __________ times. Complete this exercise __________ times per day. *If directed by your physician, physical therapist or athletic trainer, complete this stretch with your elbow bent, rather than extended. RANGE OF MOTION - Supination, Active  Stand or sit with your elbows at your side. Bend your right / left elbow to 90 degrees.  Turn your palm upward until you feel a gentle stretch on the inside of your forearm.  Hold this position for __________ seconds. Slowly release and return to the starting position. Repeat __________ times. Complete this stretch __________ times per day.  RANGE OF MOTION - Pronation, Active  Stand or sit with your elbows at your side. Bend your right / left elbow to 90 degrees.  Turn your palm downward until you feel a gentle stretch on the top of your forearm.  Hold this position for __________ seconds. Slowly release and return to the starting position. Repeat __________ times. Complete this stretch __________ times per day.  STRETCH - Wrist Flexion  Place the back of your right / left hand on a tabletop leaving your elbow   slightly bent. Your fingers should point away from your body.  Gently press the back of your hand down onto the table by straightening your elbow. You should feel a stretch on the top of your forearm.  Hold this position for __________ seconds. Repeat __________ times. Complete this stretch __________ times per day.  STRETCH - Wrist Extension  Place your right / left fingertips on a tabletop leaving your elbow slightly bent. Your fingers should point backwards.  Gently press your fingers  and palm down onto the table by straightening your elbow. You should feel a stretch on the inside of your forearm.  Hold this position for __________ seconds. Repeat __________ times. Complete this stretch __________ times per day.  STRENGTHENING EXERCISES - Wrist Sprain These exercises may help you when beginning to rehabilitate your injury. They may resolve your symptoms with or without further involvement from your physician, physical therapist or athletic trainer. While completing these exercises, remember:   Muscles can gain both the endurance and the strength needed for everyday activities through controlled exercises.  Complete these exercises as instructed by your physician, physical therapist or athletic trainer. Progress with the resistance and repetition exercises only as your caregiver advises. STRENGTH - Wrist Flexors  Sit with your right / left forearm palm-up and fully supported. Your elbow should be resting below the height of your shoulder. Allow your wrist to extend over the edge of the surface.  Loosely holding a __________ weight or a piece of rubber exercise band/tubing, slowly curl your hand up toward your forearm.  Hold this position for __________ seconds. Slowly lower the wrist back to the starting position in a controlled manner. Repeat __________ times. Complete this exercise __________ times per day.  STRENGTH - Wrist Extensors  Sit with your right / left forearm palm-down and fully supported. Your elbow should be resting below the height of your shoulder. Allow your wrist to extend over the edge of the surface.  Loosely holding a __________ weight or a piece of rubber exercise band/tubing, slowly curl your hand up toward your forearm.  Hold this position for __________ seconds. Slowly lower the wrist back to the starting position in a controlled manner. Repeat __________ times. Complete this exercise __________ times per day.  STRENGTH - Ulnar Deviators  Stand  with a ____________________ weight in your right / left hand, or sit holding on to the rubber exercise band/tubing with your opposite arm supported.  Move your wrist so that your pinkie travels toward your forearm and your thumb moves away from your forearm.  Hold this position for __________ seconds and then slowly lower the wrist back to the starting position. Repeat __________ times. Complete this exercise __________ times per day STRENGTH - Radial Deviators  Stand with a ____________________ weight in your  right / left hand, or sit holding on to the rubber exercise band/tubing with your arm supported.  Raise your hand upward in front of you or pull up on the rubber tubing.  Hold this position for __________ seconds and then slowly lower the wrist back to the starting position. Repeat __________ times. Complete this exercise __________ times per day. STRENGTH - Forearm Supinators  Sit with your right / left forearm supported on a table, keeping your elbow below shoulder height. Rest your hand over the edge, palm down.  Gently grip a hammer or a soup ladle.  Without moving your elbow, slowly turn your palm and hand upward to a "thumbs-up" position.  Hold this position for   __________ seconds. Slowly return to the starting position. Repeat __________ times. Complete this exercise __________ times per day.  STRENGTH - Forearm Pronators  Sit with your right / left forearm supported on a table, keeping your elbow below shoulder height. Rest your hand over the edge, palm up.  Gently grip a hammer or a soup ladle.  Without moving your elbow, slowly turn your palm and hand upward to a "thumbs-up" position.  Hold this position for __________ seconds. Slowly return to the starting position. Repeat __________ times. Complete this exercise __________ times per day.  STRENGTH - Grip  Grasp a tennis ball, a dense sponge, or a large, rolled sock in your hand.  Squeeze as hard as you can  without increasing any pain.  Hold this position for __________ seconds. Release your grip slowly. Repeat __________ times. Complete this exercise __________ times per day.    This information is not intended to replace advice given to you by your health care provider. Make sure you discuss any questions you have with your health care provider.   Document Released: 01/10/2005 Document Revised: 10/01/2014 Document Reviewed: 04/24/2008 Elsevier Interactive Patient Education 2016 [employer]  

## 2016-03-17 ENCOUNTER — Emergency Department (HOSPITAL_COMMUNITY)
Admission: EM | Admit: 2016-03-17 | Discharge: 2016-03-17 | Disposition: A | Discharge status: Home / Self Care | Payer: 59 | Attending: Emergency Medicine | Admitting: Emergency Medicine

## 2016-03-17 ENCOUNTER — Encounter (HOSPITAL_COMMUNITY): Payer: Self-pay

## 2016-03-17 DIAGNOSIS — F1410 Cocaine abuse, uncomplicated: Secondary | ICD-10-CM

## 2016-03-17 DIAGNOSIS — F17210 Nicotine dependence, cigarettes, uncomplicated: Secondary | ICD-10-CM | POA: Insufficient documentation

## 2016-03-17 DIAGNOSIS — R569 Unspecified convulsions: Secondary | ICD-10-CM | POA: Insufficient documentation

## 2016-03-17 HISTORY — DX: Cardiac murmur, unspecified: R01.1

## 2016-03-17 LAB — CBG MONITORING, ED: GLUCOSE-CAPILLARY: 131 mg/dL — AB (ref 65–99)

## 2016-03-17 NOTE — ED Triage Notes (Signed)
Pt. Coming from home via [facility] for altered mental status after using buccal cocaine at around 1000. Pt. Doesn't remember anything from 1000-1400. Pt. Found by friend unresponsive. EMS arrived and gave patient ~600 mL and pt. Now AOx4. Pt. States he has had a seizure a few years ago, but never seen by MD. EMS reports pt. Seeming post ictal when they arrived. Pt. Also states hx of heart murmur. Pt. Initial BP 107/60 which increased to 145/86 with fluids. Pt. ST at around 110 en route.

## 2016-03-17 NOTE — ED Provider Notes (Signed)
[facility]-EMERGENCY DEPT Provider Note   CSN: [id number] Arrival date & time: 03/17/16  1524     History   Chief Complaint Chief Complaint  Patient presents with  . Altered Mental Status    HPI Gavin Bowen is a 41 y.o. male.  HPI Patient brought to the emergency department after possible seizure.  Found unresponsive after using cocaine.  Initially was postictal and is now returned to baseline mental status at this time.  Currently the patient is without any complaints.  Denies weakness of his arms or legs.  Denies headache.  No neck pain.  He feels much better.  He reports a seizure several years ago but never was seen and evaluated by physician.  He did use cocaine at that time as well.  No tongue biting or urinary incontinence.  Patient feels great at this time.  Reports the cocaine use is very rare for him.   Past Medical History:  Diagnosis Date  . Heart murmur     There are no active problems to display for this patient.   Past Surgical History:  Procedure Laterality Date  . right index finger surgery         Home Medications    Prior to Admission medications   Not on File    Family History History reviewed. No pertinent family history.  Social History Social History  Substance Use Topics  . Smoking status: Current Every Day Smoker    Types: Cigarettes  . Smokeless tobacco: Never Used  . Alcohol use No     Allergies   Patient has no known allergies.   Review of Systems Review of Systems  All other systems reviewed and are negative.    Physical Exam Updated Vital Signs BP 159/90   Pulse 97   Temp 98.8 F (37.1 C) (Oral)   Resp 17   Ht 6\' 2"  (1.88 m)   Wt 225 lb (102.1 kg)   SpO2 98%   BMI 28.89 kg/m   Physical Exam  Constitutional: He is oriented to person, place, and time. He appears well-developed and well-nourished.  HENT:  Head: Normocephalic and atraumatic.  Eyes: EOM are normal. Pupils are equal, round, and reactive to  light.  Neck: Normal range of motion.  Cardiovascular: Normal rate, regular rhythm, normal heart sounds and intact distal pulses.   Pulmonary/Chest: Effort normal and breath sounds normal. No respiratory distress.  Abdominal: Soft. He exhibits no distension. There is no tenderness.  Musculoskeletal: Normal range of motion.  Neurological: He is alert and oriented to person, place, and time.  5/5 strength in major muscle groups of  bilateral upper and lower extremities. Speech normal. No facial asymetry.   Skin: Skin is warm and dry.  Psychiatric: He has a normal mood and affect. Judgment normal.  Nursing note and vitals reviewed.    ED Treatments / Results  Labs (all labs ordered are listed, but only abnormal results are displayed) Labs Reviewed  CBG MONITORING, ED - Abnormal; Notable for the following:       Result Value   Glucose-Capillary 131 (*)    All other components within normal limits    EKG  EKG Interpretation  Date/Time:  Thursday March 17 2016 15:29:38 EST Ventricular Rate:  106 PR Interval:    QRS Duration: 93 QT Interval:  390 QTC Calculation: 518 R Axis:   92 Text Interpretation:  Sinus tachycardia Consider left ventricular hypertrophy Probable inferior infarct, old Lateral leads are also involved Prolonged QT interval  No significant change was found Confirmed by Anvita Hirata  MD, Caryn BeeKEVIN ([id number]) on 03/17/2016 3:40:15 PM       Radiology No results found.  Procedures Procedures (including critical care time)  Medications Ordered in ED Medications - No data to display   Initial Impression / Assessment and Plan / ED Course  I have reviewed the triage vital signs and the nursing notes.  Pertinent labs & imaging results that were available during my care of the patient were reviewed by me and considered in my medical decision making (see chart for details).     Overall well-appearing.  No chest pain or headache.  Normal neurologic exam.  No indication for  additional workup.  Questionable abnormal rhythm noted by nursing staff this is an incomplete left bundle branch which looks wide on telemetry.  This is not ventricular tachycardia.  Discharge home in good condition.  Primary care and neurology follow-up.  Seizure precautions given  Final Clinical Impressions(s) / ED Diagnoses   Final diagnoses:  Seizure (HCC)  Cocaine abuse    New Prescriptions New Prescriptions   No medications on file     Azalia BilisKevin Jeffrie Lofstrom, MD 03/17/16 1726

## 2016-03-17 NOTE — ED Notes (Addendum)
Pt. Abnormal rhythm on cardiac monitor. EDP made aware. Looked to be ventricular rhythm at 106 bpm. Pt. Denies pain. Pulse strong. Does not appear to be ventricular tachycardia as monitor alarm indicated.

## 2017-11-17 ENCOUNTER — Encounter (HOSPITAL_COMMUNITY): Payer: Self-pay | Admitting: Emergency Medicine

## 2017-11-17 ENCOUNTER — Emergency Department (HOSPITAL_COMMUNITY)
Admission: EM | Admit: 2017-11-17 | Discharge: 2017-11-18 | Disposition: A | Discharge status: Home / Self Care | Payer: BLUE CROSS/BLUE SHIELD | Attending: Emergency Medicine | Admitting: Emergency Medicine

## 2017-11-17 ENCOUNTER — Other Ambulatory Visit: Payer: Self-pay

## 2017-11-17 ENCOUNTER — Emergency Department (HOSPITAL_COMMUNITY): Payer: BLUE CROSS/BLUE SHIELD

## 2017-11-17 DIAGNOSIS — R109 Unspecified abdominal pain: Secondary | ICD-10-CM | POA: Diagnosis not present

## 2017-11-17 DIAGNOSIS — K625 Hemorrhage of anus and rectum: Secondary | ICD-10-CM | POA: Diagnosis present

## 2017-11-17 DIAGNOSIS — F1290 Cannabis use, unspecified, uncomplicated: Secondary | ICD-10-CM | POA: Diagnosis not present

## 2017-11-17 DIAGNOSIS — K921 Melena: Secondary | ICD-10-CM | POA: Diagnosis not present

## 2017-11-17 DIAGNOSIS — F1410 Cocaine abuse, uncomplicated: Secondary | ICD-10-CM | POA: Diagnosis not present

## 2017-11-17 DIAGNOSIS — F17210 Nicotine dependence, cigarettes, uncomplicated: Secondary | ICD-10-CM | POA: Insufficient documentation

## 2017-11-17 LAB — ETHANOL

## 2017-11-17 LAB — URINALYSIS, ROUTINE W REFLEX MICROSCOPIC
Bilirubin Urine: NEGATIVE
GLUCOSE, UA: NEGATIVE mg/dL
Hgb urine dipstick: NEGATIVE
KETONES UR: NEGATIVE mg/dL
LEUKOCYTES UA: NEGATIVE
NITRITE: NEGATIVE
PROTEIN: NEGATIVE mg/dL
Specific Gravity, Urine: 1.02 (ref 1.005–1.030)
pH: 8 (ref 5.0–8.0)

## 2017-11-17 LAB — COMPREHENSIVE METABOLIC PANEL
ALBUMIN: 4 g/dL (ref 3.5–5.0)
ALT: 19 U/L (ref 0–44)
ANION GAP: 9 (ref 5–15)
AST: 21 U/L (ref 15–41)
Alkaline Phosphatase: 54 U/L (ref 38–126)
BUN: 6 mg/dL (ref 6–20)
CALCIUM: 9.5 mg/dL (ref 8.9–10.3)
CO2: 29 mmol/L (ref 22–32)
Chloride: 103 mmol/L (ref 98–111)
Creatinine, Ser: 0.95 mg/dL (ref 0.61–1.24)
GFR calc Af Amer: >60 mL/min (ref >60)
GFR calc non Af Amer: >60 mL/min (ref >60)
GLUCOSE: 124 mg/dL — AB (ref 70–99)
Potassium: 3.5 mmol/L (ref 3.5–5.1)
SODIUM: 141 mmol/L (ref 135–145)
TOTAL PROTEIN: 7.2 g/dL (ref 6.5–8.1)
Total Bilirubin: 0.6 mg/dL (ref 0.3–1.2)

## 2017-11-17 LAB — RAPID URINE DRUG SCREEN, HOSP PERFORMED
AMPHETAMINES: NOT DETECTED
Barbiturates: NOT DETECTED
Benzodiazepines: NOT DETECTED
Cocaine: POSITIVE — AB
Opiates: NOT DETECTED
TETRAHYDROCANNABINOL: POSITIVE — AB

## 2017-11-17 LAB — CBC
HCT: 36.6 % — ABNORMAL LOW (ref 39.0–52.0)
Hemoglobin: 13 g/dL (ref 13.0–17.0)
MCH: 30.3 pg (ref 26.0–34.0)
MCHC: 35.5 g/dL (ref 30.0–36.0)
MCV: 85.3 fL (ref 80.0–100.0)
NRBC: 0 % (ref 0.0–0.2)
PLATELETS: 344 10*3/uL (ref 150–400)
RBC: 4.29 MIL/uL (ref 4.22–5.81)
RDW: 12.4 % (ref 11.5–15.5)
WBC: 10.9 10*3/uL — ABNORMAL HIGH (ref 4.0–10.5)

## 2017-11-17 LAB — I-STAT CG4 LACTIC ACID, ED: Lactic Acid, Venous: 1.45 mmol/L (ref 0.5–1.9)

## 2017-11-17 MED ORDER — SODIUM CHLORIDE 0.9 % IJ SOLN
INTRAMUSCULAR | Status: AC
  Filled 2017-11-17: qty 50

## 2017-11-17 MED ORDER — SODIUM CHLORIDE 0.9 % IV BOLUS
1000.0000 mL | Freq: Once | INTRAVENOUS | Status: AC
  Administered 2017-11-17: 1000 mL via INTRAVENOUS

## 2017-11-17 MED ORDER — IOPAMIDOL (ISOVUE-300) INJECTION 61%
100.0000 mL | Freq: Once | INTRAVENOUS | Status: AC | PRN
  Administered 2017-11-17: 100 mL via INTRAVENOUS

## 2017-11-17 MED ORDER — IOPAMIDOL (ISOVUE-300) INJECTION 61%
INTRAVENOUS | Status: AC
  Filled 2017-11-17: qty 100

## 2017-11-17 NOTE — ED Triage Notes (Addendum)
Patient c/o intermittent dark blood with clots in stools and intermittent diarrhea for "months." Denies N/V and abdominal pain. Patient also c/o urinary frequency and states "I saw online these are all signs of colon cancer."  Patient adds he was "partying and popping pills in [place] last week."

## 2017-11-17 NOTE — ED Notes (Addendum)
Pt states he has swallowed a lot of cocaine in the past week, and has for many years

## 2017-11-17 NOTE — ED Provider Notes (Signed)
[facility]-EMERGENCY DEPT Provider Note   CSN: [id number] Arrival date & time: 11/17/17  1951     History   Chief Complaint Chief Complaint  Patient presents with  . Rectal Bleeding    HPI Gavin Bowen is a 42 y.o. male history of cocaine and marijuana abuse here presenting with rectal bleeding, abdominal cramps.  Patient states that he went down to [place] recently and has been " living it up".  He does admit to marijuana use chronically as well as he has been ingesting cocaine.  He states that he follows large quantity of cocaine and the last use was about a week ago.  He states that for the last several weeks he has noticed some blood in his stools almost daily.  He also has some diffuse abdominal cramps as well.  He was looking online and was concerned for possible colon cancer.  Patient denies any fevers or chills or history of inflammatory bowel disease.  Patient denies any homicidal or suicidal ideations.   The history is provided by the patient.    Past Medical History:  Diagnosis Date  . Heart murmur     There are no active problems to display for this patient.   Past Surgical History:  Procedure Laterality Date  . right index finger surgery          Home Medications    Prior to Admission medications   Not on File    Family History No family history on file.  Social History Social History   Tobacco Use  . Smoking status: Current Every Day Smoker    Types: Cigarettes  . Smokeless tobacco: Never Used  Substance Use Topics  . Alcohol use: No  . Drug use: Yes    Types: Cocaine     Allergies   Patient has no known allergies.   Review of Systems Review of Systems  Gastrointestinal: Positive for abdominal pain, blood in stool and hematochezia.  All other systems reviewed and are negative.    Physical Exam Updated Vital Signs BP 138/83   Pulse 85   Temp 98 F (36.7 C) (Oral)   Resp 19   Ht 6\' 1"  (1.854 m)   Wt  102.1 kg   SpO2 99%   BMI 29.69 kg/m   Physical Exam  Constitutional: He is oriented to person, place, and time. He appears well-developed and well-nourished.  Anxious   HENT:  Head: Normocephalic.  Mouth/Throat: Oropharynx is clear and moist.  Eyes: Pupils are equal, round, and reactive to light. Conjunctivae and EOM are normal.  Neck: Normal range of motion. Neck supple.  Cardiovascular: Normal rate, regular rhythm and normal heart sounds.  Pulmonary/Chest: Effort normal and breath sounds normal. No stridor. No respiratory distress.  Abdominal: Soft. Bowel sounds are normal.  Mild diffuse tenderness, no rebound or guarding   Musculoskeletal: Normal range of motion.  Neurological: He is alert and oriented to person, place, and time. No cranial nerve deficit. Coordination normal.  Skin: Skin is warm. Capillary refill takes less than 2 seconds.  Psychiatric: He has a normal mood and affect.  Nursing note and vitals reviewed.    ED Treatments / Results  Labs (all labs ordered are listed, but only abnormal results are displayed) Labs Reviewed  COMPREHENSIVE METABOLIC PANEL - Abnormal; Notable for the following components:      Result Value   Glucose, Bld 124 (*)    All other components within normal limits  CBC - Abnormal;  Notable for the following components:   WBC 10.9 (*)    HCT 36.6 (*)    All other components within normal limits  URINALYSIS, ROUTINE W REFLEX MICROSCOPIC - Abnormal; Notable for the following components:   APPearance CLOUDY (*)    All other components within normal limits  RAPID URINE DRUG SCREEN, HOSP PERFORMED - Abnormal; Notable for the following components:   Cocaine POSITIVE (*)    Tetrahydrocannabinol POSITIVE (*)    All other components within normal limits  ETHANOL  I-STAT CG4 LACTIC ACID, ED  TYPE AND SCREEN    EKG None  Radiology Ct Abdomen Pelvis W Contrast  Result Date: 11/17/2017 CLINICAL DATA:  Blood in stool.  Recent cocaine  use. EXAM: CT ABDOMEN AND PELVIS WITH CONTRAST TECHNIQUE: Multidetector CT imaging of the abdomen and pelvis was performed using the standard protocol following bolus administration of intravenous contrast. CONTRAST:  ISOVUE-300 IOPAMIDOL (ISOVUE-300) INJECTION 61% COMPARISON:  CT abdomen pelvis 08/18/2004 FINDINGS: LOWER CHEST: There is no basilar pleural or apical pericardial effusion. HEPATOBILIARY: The hepatic contours and density are normal. There is no intra- or extrahepatic biliary dilatation. The gallbladder is normal. PANCREAS: The pancreatic parenchymal contours are normal and there is no ductal dilatation. There is no peripancreatic fluid collection. SPLEEN: Normal. ADRENALS/URINARY TRACT: --Adrenal glands: Normal. --Right kidney/ureter: Single nonobstructing renal calculus measuring 8 mm. No hydronephrosis, perinephric stranding or solid renal mass. --Left kidney/ureter: No hydronephrosis, nephroureterolithiasis, perinephric stranding or solid renal mass. --Urinary bladder: Normal for degree of distention STOMACH/BOWEL: --Stomach/Duodenum: There is no hiatal hernia or other gastric abnormality. The duodenal course and caliber are normal. --Small bowel: No dilatation or inflammation. --Colon: No focal abnormality. --Appendix: Normal. VASCULAR/LYMPHATIC: Normal course and caliber of the major abdominal vessels. No abdominal or pelvic lymphadenopathy. REPRODUCTIVE: No free fluid in the pelvis. MUSCULOSKELETAL. No bony spinal canal stenosis or focal osseous abnormality. OTHER: None. IMPRESSION: 1. No acute abnormality of the abdomen or pelvis. 2. Unchanged nonobstructing right renal calculus. Electronically Signed   By: Deatra Robinson M.D.   On: 11/17/2017 22:51    Procedures Procedures (including critical care time)  Medications Ordered in ED Medications  sodium chloride 0.9 % injection (has no administration in time range)  sodium chloride 0.9 % bolus 1,000 mL ( Intravenous Rate/Dose Verify  11/17/17 2300)  iopamidol (ISOVUE-300) 61 % injection 100 mL (100 mLs Intravenous Contrast Given 11/17/17 2228)     Initial Impression / Assessment and Plan / ED Course  I have reviewed the triage vital signs and the nursing notes.  Pertinent labs & imaging results that were available during my care of the patient were reviewed by me and considered in my medical decision making (see chart for details).    MACRAE WIEGMAN is a 42 y.o. male here with abdominal pain, blood in stool. He ingests cocaine so I think likely had blood in stool from that. Consider ischemic colitis vs gastroenteritis. Will get labs, CT ab/pel.   11:24 PM Hg is 13. UDS + cocaine and marijuana which he admits to. UA normal. CT ab/pel showed no colitis. Lactate normal. Stable for discharge. Told him to quit ingesting cocaine.    Final Clinical Impressions(s) / ED Diagnoses   Final diagnoses:  None    ED Discharge Orders    None       Charlynne Pander, MD 11/17/17 2325

## 2017-11-17 NOTE — Discharge Instructions (Addendum)
Avoid using cocaine as it will causing abdominal cramps and blood in stool.   Stay hydrated.   See your doctor  Return to ER if you have worse abdominal pain, uncontrolled bleeding, passing out, fever, vomiting.

## 2017-11-18 LAB — TYPE AND SCREEN
ABO/RH(D): O POS
Antibody Screen: NEGATIVE

## 2017-11-18 LAB — ABO/RH: ABO/RH(D): O POS

## 2020-10-13 ENCOUNTER — Emergency Department (HOSPITAL_BASED_OUTPATIENT_CLINIC_OR_DEPARTMENT_OTHER)
Admission: EM | Admit: 2020-10-13 | Discharge: 2020-10-13 | Disposition: A | Discharge status: Home / Self Care | Payer: Self-pay | Attending: Emergency Medicine | Admitting: Emergency Medicine

## 2020-10-13 ENCOUNTER — Emergency Department (HOSPITAL_BASED_OUTPATIENT_CLINIC_OR_DEPARTMENT_OTHER): Payer: Self-pay

## 2020-10-13 ENCOUNTER — Other Ambulatory Visit: Payer: Self-pay

## 2020-10-13 ENCOUNTER — Emergency Department (HOSPITAL_COMMUNITY): Admission: EM | Admit: 2020-10-13 | Discharge: 2020-10-13 | Discharge status: Against Medical Advice | Payer: 59

## 2020-10-13 ENCOUNTER — Encounter (HOSPITAL_BASED_OUTPATIENT_CLINIC_OR_DEPARTMENT_OTHER): Payer: Self-pay | Admitting: *Deleted

## 2020-10-13 ENCOUNTER — Emergency Department (HOSPITAL_BASED_OUTPATIENT_CLINIC_OR_DEPARTMENT_OTHER): Payer: Self-pay | Admitting: Radiology

## 2020-10-13 DIAGNOSIS — N200 Calculus of kidney: Secondary | ICD-10-CM | POA: Diagnosis not present

## 2020-10-13 DIAGNOSIS — S01111A Laceration without foreign body of right eyelid and periocular area, initial encounter: Secondary | ICD-10-CM | POA: Diagnosis not present

## 2020-10-13 DIAGNOSIS — S0181XA Laceration without foreign body of other part of head, initial encounter: Secondary | ICD-10-CM | POA: Diagnosis not present

## 2020-10-13 DIAGNOSIS — Z20822 Contact with and (suspected) exposure to covid-19: Secondary | ICD-10-CM | POA: Diagnosis not present

## 2020-10-13 DIAGNOSIS — S42201A Unspecified fracture of upper end of right humerus, initial encounter for closed fracture: Secondary | ICD-10-CM | POA: Insufficient documentation

## 2020-10-13 DIAGNOSIS — F17210 Nicotine dependence, cigarettes, uncomplicated: Secondary | ICD-10-CM | POA: Insufficient documentation

## 2020-10-13 DIAGNOSIS — Y92410 Unspecified street and highway as the place of occurrence of the external cause: Secondary | ICD-10-CM | POA: Insufficient documentation

## 2020-10-13 DIAGNOSIS — T1490XA Injury, unspecified, initial encounter: Secondary | ICD-10-CM

## 2020-10-13 DIAGNOSIS — Y900 Blood alcohol level of less than 20 mg/100 ml: Secondary | ICD-10-CM | POA: Insufficient documentation

## 2020-10-13 DIAGNOSIS — F0781 Postconcussional syndrome: Secondary | ICD-10-CM | POA: Insufficient documentation

## 2020-10-13 DIAGNOSIS — T07XXXA Unspecified multiple injuries, initial encounter: Secondary | ICD-10-CM

## 2020-10-13 DIAGNOSIS — S20219A Contusion of unspecified front wall of thorax, initial encounter: Secondary | ICD-10-CM | POA: Insufficient documentation

## 2020-10-13 DIAGNOSIS — R079 Chest pain, unspecified: Secondary | ICD-10-CM | POA: Diagnosis not present

## 2020-10-13 DIAGNOSIS — S4991XA Unspecified injury of right shoulder and upper arm, initial encounter: Secondary | ICD-10-CM | POA: Diagnosis present

## 2020-10-13 LAB — CBC
HCT: 34.7 % — ABNORMAL LOW (ref 39.0–52.0)
Hemoglobin: 12.4 g/dL — ABNORMAL LOW (ref 13.0–17.0)
MCH: 30 pg (ref 26.0–34.0)
MCHC: 35.7 g/dL (ref 30.0–36.0)
MCV: 84 fL (ref 80.0–100.0)
Platelets: 249 10*3/uL (ref 150–400)
RBC: 4.13 MIL/uL — ABNORMAL LOW (ref 4.22–5.81)
RDW: 12.6 % (ref 11.5–15.5)
WBC: 15.8 10*3/uL — ABNORMAL HIGH (ref 4.0–10.5)
nRBC: 0 % (ref 0.0–0.2)

## 2020-10-13 LAB — COMPREHENSIVE METABOLIC PANEL
ALT: 25 U/L (ref 0–44)
AST: 29 U/L (ref 15–41)
Albumin: 4.4 g/dL (ref 3.5–5.0)
Alkaline Phosphatase: 52 U/L (ref 38–126)
Anion gap: 7 (ref 5–15)
BUN: 12 mg/dL (ref 6–20)
CO2: 31 mmol/L (ref 22–32)
Calcium: 9.8 mg/dL (ref 8.9–10.3)
Chloride: 101 mmol/L (ref 98–111)
Creatinine, Ser: 1.11 mg/dL (ref 0.61–1.24)
GFR, Estimated: >60 mL/min (ref >60)
Glucose, Bld: 106 mg/dL — ABNORMAL HIGH (ref 70–99)
Potassium: 3.8 mmol/L (ref 3.5–5.1)
Sodium: 139 mmol/L (ref 135–145)
Total Bilirubin: 0.8 mg/dL (ref 0.3–1.2)
Total Protein: 7.5 g/dL (ref 6.5–8.1)

## 2020-10-13 LAB — RESP PANEL BY RT-PCR (FLU A&B, COVID) ARPGX2
Influenza A by PCR: NEGATIVE
Influenza B by PCR: NEGATIVE
SARS Coronavirus 2 by RT PCR: NEGATIVE

## 2020-10-13 LAB — PROTIME-INR
INR: 1 (ref 0.8–1.2)
Prothrombin Time: 13.6 seconds (ref 11.4–15.2)

## 2020-10-13 LAB — ETHANOL: Alcohol, Ethyl (B): <10 mg/dL (ref <10)

## 2020-10-13 MED ORDER — BACITRACIN ZINC 500 UNIT/GM EX OINT: TOPICAL_OINTMENT | Freq: Two times a day (BID) | CUTANEOUS | Status: DC

## 2020-10-13 MED ORDER — MORPHINE SULFATE (PF) 4 MG/ML IV SOLN
4.0000 mg | Freq: Once | INTRAVENOUS | Status: DC | PRN
Start: 2020-10-13 — End: 2020-10-13

## 2020-10-13 MED ORDER — LIDOCAINE-EPINEPHRINE (PF) 2 %-1:200000 IJ SOLN
20.0000 mL | Freq: Once | INTRAMUSCULAR | Status: AC
  Administered 2020-10-13: 20 mL
  Filled 2020-10-13: qty 20

## 2020-10-13 MED ORDER — BACITRACIN ZINC 500 UNIT/GM EX OINT: 1.0000 "application " | TOPICAL_OINTMENT | Freq: Two times a day (BID) | CUTANEOUS | 0 refills | Status: DC

## 2020-10-13 MED ORDER — ACETAMINOPHEN 500 MG PO TABS: 500.0000 mg | ORAL_TABLET | Freq: Four times a day (QID) | ORAL | 0 refills | Status: DC | PRN

## 2020-10-13 MED ORDER — OXYCODONE-ACETAMINOPHEN 5-325 MG PO TABS: 1.0000 | ORAL_TABLET | Freq: Four times a day (QID) | ORAL | 0 refills | Status: DC | PRN

## 2020-10-13 MED ORDER — IOHEXOL 350 MG/ML SOLN
80.0000 mL | Freq: Once | INTRAVENOUS | Status: AC | PRN
  Administered 2020-10-13: 80 mL via INTRAVENOUS

## 2020-10-13 MED ORDER — MORPHINE SULFATE (PF) 4 MG/ML IV SOLN
6.0000 mg | Freq: Once | INTRAVENOUS | Status: DC
Start: 2020-10-13 — End: 2020-10-13
  Filled 2020-10-13: qty 2
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
## 2020-10-13 NOTE — ED Notes (Signed)
Nurse with patient on monitor to CT scan and xray.

## 2020-10-20 ENCOUNTER — Other Ambulatory Visit: Payer: Self-pay

## 2020-10-20 ENCOUNTER — Ambulatory Visit (HOSPITAL_BASED_OUTPATIENT_CLINIC_OR_DEPARTMENT_OTHER): Payer: Self-pay | Admitting: Orthopaedic Surgery

## 2020-10-20 VITALS — Ht 73.5 in | Wt 180.0 lb

## 2020-10-20 DIAGNOSIS — S42291A Other displaced fracture of upper end of right humerus, initial encounter for closed fracture: Secondary | ICD-10-CM
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
## 2020-10-23 ENCOUNTER — Ambulatory Visit (HOSPITAL_BASED_OUTPATIENT_CLINIC_OR_DEPARTMENT_OTHER): Payer: Self-pay | Admitting: Orthopaedic Surgery

## 2020-10-26 ENCOUNTER — Encounter (HOSPITAL_BASED_OUTPATIENT_CLINIC_OR_DEPARTMENT_OTHER): Payer: Self-pay | Admitting: Orthopaedic Surgery

## 2020-10-26 ENCOUNTER — Telehealth (HOSPITAL_BASED_OUTPATIENT_CLINIC_OR_DEPARTMENT_OTHER): Payer: Self-pay | Admitting: Orthopaedic Surgery

## 2020-10-26 NOTE — Progress Notes (Signed)
Attempted to call the patient twice regarding his decision for surgical intervention for his shoulder. No answer. Phone was turned off and went straight to voicemail.

## 2020-10-26 NOTE — Telephone Encounter (Signed)
Attempted to call patient, no answer 

## 2021-02-05 ENCOUNTER — Encounter (HOSPITAL_BASED_OUTPATIENT_CLINIC_OR_DEPARTMENT_OTHER): Payer: Self-pay | Admitting: Orthopaedic Surgery

## 2021-08-05 ENCOUNTER — Emergency Department (HOSPITAL_COMMUNITY)
Admission: EM | Admit: 2021-08-05 | Discharge: 2021-08-06 | Disposition: A | Discharge status: Home / Self Care | Payer: Self-pay | Attending: Emergency Medicine | Admitting: Emergency Medicine

## 2021-08-05 DIAGNOSIS — M25462 Effusion, left knee: Secondary | ICD-10-CM | POA: Insufficient documentation

## 2021-08-06 ENCOUNTER — Emergency Department (HOSPITAL_COMMUNITY): Payer: Self-pay

## 2021-08-06 ENCOUNTER — Encounter (HOSPITAL_COMMUNITY): Payer: Self-pay

## 2021-08-06 ENCOUNTER — Other Ambulatory Visit: Payer: Self-pay

## 2021-08-06 MED ORDER — IBUPROFEN 800 MG PO TABS
800.0000 mg | ORAL_TABLET | Freq: Once | ORAL | Status: AC
  Administered 2021-08-06: 800 mg via ORAL
  Filled 2021-08-06: qty 1

## 2021-08-06 NOTE — ED Triage Notes (Signed)
Pt presents to ED from home, states he fell 2 weeks ago. Pt reporting decreased mobility, swelling, and increased pain of the left knee. Was not seen at time of fall.

## 2021-08-06 NOTE — ED Provider Notes (Signed)
[facility]-EMERGENCY DEPT Provider Note   CSN: [id number] Arrival date & time: 08/05/21  2332     History  Chief Complaint  Patient presents with   Knee Pain    Gavin Bowen is a 46 y.o. male.   Knee Pain   Patient presents with left knee pain x2 weeks.  Happened when he from sitting position down to the floor.  The pain is to the lateral part of his left knee, he is not having any fevers or chills.  The pain is worse with flexion.  He is able to bear weight and ambulate.  No meds prior to arrival.  Home Medications Prior to Admission medications   Medication Sig Start Date End Date Taking? Authorizing Provider  acetaminophen (TYLENOL) 500 MG tablet Take 1 tablet (500 mg total) by mouth every 6 (six) hours as needed. 10/13/20   Derwood Kaplan, MD  bacitracin ointment Apply 1 application topically 2 (two) times daily. 10/13/20   Derwood Kaplan, MD  oxyCODONE-acetaminophen (PERCOCET/ROXICET) 5-325 MG tablet Take 1 tablet by mouth every 6 (six) hours as needed for severe pain. 10/13/20   Derwood Kaplan, MD      Allergies    Patient has no known allergies.    Review of Systems   Review of Systems  Physical Exam Updated Vital Signs BP (!) 146/88   Pulse 67   Temp 98.2 F (36.8 C) (Oral)   Resp 16   Ht 6\' 2"  (1.88 m)   Wt 79.4 kg   SpO2 98%   BMI 22.47 kg/m  Physical Exam Vitals and nursing note reviewed. Exam conducted with a chaperone present.  Constitutional:      General: He is not in acute distress.    Appearance: Normal appearance.  HENT:     Head: Normocephalic and atraumatic.  Eyes:     General: No scleral icterus.    Extraocular Movements: Extraocular movements intact.     Pupils: Pupils are equal, round, and reactive to light.  Musculoskeletal:        General: Swelling present.     Comments: Patient has lateral effusion to left knee.  Able to tolerate passive ROM, slightly decreased flexion of left knee, normal internal and  external rotation.  Able to extend.  Able to bear weight and ambulate.  Skin:    Coloration: Skin is not jaundiced.  Neurological:     Mental Status: He is alert. Mental status is at baseline.     Coordination: Coordination normal.     ED Results / Procedures / Treatments   Labs (all labs ordered are listed, but only abnormal results are displayed) Labs Reviewed - No data to display  EKG None  Radiology DG Knee Complete 4 Views Left  Result Date: 08/06/2021 CLINICAL DATA:  Knee pain EXAM: LEFT KNEE - COMPLETE 4+ VIEW COMPARISON:  None Available. FINDINGS: Normal alignment. No acute fracture or dislocation. Joint spaces are preserved. Small left knee effusion is present. Soft tissues are otherwise unremarkable. IMPRESSION: Small left knee effusion. Electronically Signed   By: 08/08/2021 M.D.   On: 08/06/2021 00:25    Procedures Procedures    Medications Ordered in ED Medications  ibuprofen (ADVIL) tablet 800 mg (has no administration in time range)    ED Course/ Medical Decision Making/ A&P                           Medical Decision Making Amount and/or  Complexity of Data Reviewed Radiology: ordered.   Patient presents due to knee pain.  He is neurovascular intact with brisk cap refill and DP and PT are 2+.  No overlying skin discoloration.  No crepitus or bony tenderness, plain films reviewed which show an effusion consistent with physical exam findings.    Knee effusion - NSAIDS, RICE.        Final Clinical Impression(s) / ED Diagnoses Final diagnoses:  Effusion of left knee    Rx / DC Orders ED Discharge Orders     None         Theron Arista, PA-C 08/06/21 0039    Pollyann Savoy, MD 08/06/21 [phone]

## 2021-08-06 NOTE — Discharge Instructions (Addendum)
You were seen today for knee pain.  You have a knee effusion which is a collection of fluid in the lateral part of your knee.  Information attached to read.    Take ibuprofen 800 mg 3 times daily for the next 7 days.  Ice your knee, also try to elevate when at rest.  Return if you get fevers, new or worsening symptoms.
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
## 2022-04-28 ENCOUNTER — Emergency Department (HOSPITAL_COMMUNITY)
Admission: EM | Admit: 2022-04-28 | Discharge: 2022-04-28 | Disposition: A | Discharge status: Home / Self Care | Payer: BC Managed Care – PPO | Attending: Emergency Medicine | Admitting: Emergency Medicine

## 2022-04-28 ENCOUNTER — Other Ambulatory Visit: Payer: Self-pay

## 2022-04-28 ENCOUNTER — Encounter (HOSPITAL_COMMUNITY): Payer: Self-pay

## 2022-04-28 ENCOUNTER — Emergency Department (HOSPITAL_COMMUNITY): Payer: BC Managed Care – PPO

## 2022-04-28 DIAGNOSIS — X58XXXA Exposure to other specified factors, initial encounter: Secondary | ICD-10-CM | POA: Diagnosis not present

## 2022-04-28 DIAGNOSIS — S63392A Traumatic rupture of other ligament of left wrist, initial encounter: Secondary | ICD-10-CM | POA: Diagnosis not present

## 2022-04-28 DIAGNOSIS — M25332 Other instability, left wrist: Secondary | ICD-10-CM

## 2022-04-28 DIAGNOSIS — M25532 Pain in left wrist: Secondary | ICD-10-CM | POA: Diagnosis present

## 2022-04-28 MED ORDER — IPRATROPIUM-ALBUTEROL 0.5-2.5 (3) MG/3ML IN SOLN
RESPIRATORY_TRACT | Status: AC
  Filled 2022-04-28: qty 3

## 2022-04-28 MED ORDER — HYDROCODONE-ACETAMINOPHEN 5-325 MG PO TABS
1.0000 | ORAL_TABLET | Freq: Once | ORAL | Status: AC
  Administered 2022-04-28: 1 via ORAL
  Filled 2022-04-28: qty 1

## 2022-04-28 MED ORDER — LORAZEPAM 1 MG PO TABS: 1.0000 mg | ORAL_TABLET | Freq: Once | ORAL | Status: DC

## 2022-04-28 MED ORDER — HYDROCODONE-ACETAMINOPHEN 5-325 MG PO TABS: 1.0000 | ORAL_TABLET | Freq: Four times a day (QID) | ORAL | 0 refills | Status: DC | PRN

## 2022-04-28 NOTE — Discharge Instructions (Addendum)
Return to ED with any new or worsening signs or symptoms such as fevers Please follow-up with hand surgeon I referred you to.  Please call and make an appointment to be seen. Please treat pain with ibuprofen or Tylenol.  If pain persist please take hydrocodone.  Please do not drive or operate heavy machinery under the influence of pain medication. Please read attached guide concerning your diagnosis

## 2022-04-28 NOTE — ED Triage Notes (Signed)
Pt c/o left wrist [id number]. Pt is a Glass blower/designer. Pt states at times when he picks stuff up with his left hand, he drops things. Pt has int numbness and tingling. Pt has 3+ swelling of left wrist. Pt has 2+ left radial pulse, warm to touch, cap refill less than 3 sec, 4/5 left grip strength.

## 2022-04-28 NOTE — ED Provider Notes (Signed)
[facility] Provider Note   CSN: [id number] Arrival date & time: 04/28/22  1614     History  Chief Complaint  Patient presents with   Wrist Pain    Gavin Bowen is a 47 y.o. male with medical history of heart murmur.  Patient presents to ED for evaluation of left wrist pain.  Patient reports that for the last "few months" he has had left wrist pain exacerbated by his job.  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  Patient reports that he has been taking ibuprofen and Tylenol at home which will slightly alleviate symptoms however symptoms always return.  Patient states that yesterday the pain acutely worsened more so than it had ever done in the past.  The patient states that this morning he woke up with swelling to the top part of his left wrist.  Patient reports that pain was so significant he had to miss work yesterday and today.  Patient denies fevers, overlying skin change, numbness or tingling into the left hand.  Patient denies trauma to account for pain.   Wrist Pain       Home Medications Prior to Admission medications   Medication Sig Start Date End Date Taking? Authorizing Provider  HYDROcodone-acetaminophen (NORCO/VICODIN) 5-325 MG tablet Take 1 tablet by mouth every 6 (six) hours as needed for severe pain. 04/28/22  Yes Azucena Cecil, PA-C  acetaminophen (TYLENOL) 500 MG tablet Take 1 tablet (500 mg total) by mouth every 6 (six) hours as needed. 10/13/20   Varney Biles, MD  bacitracin ointment Apply 1 application topically 2 (two) times daily. 10/13/20   Varney Biles, MD  oxyCODONE-acetaminophen (PERCOCET/ROXICET) 5-325 MG tablet Take 1 tablet by mouth every 6 (six) hours as needed for severe pain. 10/13/20   Varney Biles, MD      Allergies    Patient has no known allergies.    Review of Systems   Review of Systems  Musculoskeletal:  Positive for arthralgias.  All other  systems reviewed and are negative.   Physical Exam Updated Vital Signs BP (!) 152/112   Pulse 60   Temp (!) 97.3 F (36.3 C) (Oral)   Resp 20   Ht 6\' 2"  (1.88 m)   Wt 79.4 kg   SpO2 97%   BMI 22.47 kg/m  Physical Exam Vitals and nursing note reviewed.  Constitutional:      General: He is not in acute distress.    Appearance: He is not ill-appearing, toxic-appearing or diaphoretic.  HENT:     Head: Normocephalic and atraumatic.     Nose: Nose normal.     Mouth/Throat:     Mouth: Mucous membranes are moist.     Pharynx: Oropharynx is clear.  Eyes:     Extraocular Movements: Extraocular movements intact.     Conjunctiva/sclera: Conjunctivae normal.     Pupils: Pupils are equal, round, and reactive to light.  Cardiovascular:     Rate and Rhythm: Normal rate and regular rhythm.  Pulmonary:     Effort: Pulmonary effort is normal.     Breath sounds: Normal breath sounds. No wheezing.  Abdominal:     General: Abdomen is flat. Bowel sounds are normal.     Palpations: Abdomen is soft.     Tenderness: There is no abdominal tenderness.  Musculoskeletal:     Right wrist: Normal.     Left wrist: Swelling and deformity present. Decreased range  of motion.     Cervical back: Normal range of motion and neck supple. No tenderness.     Comments: Decreased range of motion, swelling to top portion of left wrist.  Reduced range of motion, nonfocal tenderness.  Reduced grip strength 2 out of 5.  Skin:    General: Skin is warm and dry.     Capillary Refill: Capillary refill takes less than 2 seconds.  Neurological:     General: No focal deficit present.     Mental Status: He is alert and oriented to person, place, and time.     GCS: GCS eye subscore is 4. GCS verbal subscore is 5. GCS motor subscore is 6.     Cranial Nerves: Cranial nerves 2-12 are intact.     Sensory: Sensation is intact.     Motor: Motor function is intact. No weakness.     Comments: Decreased strength to left wrist 2  out of 5     ED Results / Procedures / Treatments   Labs (all labs ordered are listed, but only abnormal results are displayed) Labs Reviewed - No data to display  EKG None  Radiology DG Wrist Complete Left  Result Date: 04/28/2022 CLINICAL DATA:  Pain EXAM: LEFT WRIST - COMPLETE 3 VIEW COMPARISON:  None Available. FINDINGS: There is widening of the scapholunate interval which could indicate a scapholunate ligament injury. This could be evaluated further with MRI. There is narrowing of the radiocarpal joint spaces and sclerosis consistent with osteoarthritis. No acute fracture, dislocation or subluxation identified. IMPRESSION: Degenerative changes. Possible scapholunate ligament injury with widening of the scapholunate interval. Electronically Signed   By: Layla Maw M.D.   On: 04/28/2022 17:30    Procedures Procedures   Medications Ordered in ED Medications  ipratropium-albuterol (DUONEB) 0.5-2.5 (3) MG/3ML nebulizer solution (has no administration in time range)  HYDROcodone-acetaminophen (NORCO/VICODIN) 5-325 MG per tablet 1 tablet (1 tablet Oral Given 04/28/22 2059)    ED Course/ Medical Decision Making/ A&P  Medical Decision Making Amount and/or Complexity of Data Reviewed Radiology: ordered.   47 year old male presents to ED for evaluation of left wrist pain.  Please see HPI for further details.  On examination the patient left wrist episode swelling to the top portion.  Patient left wrist has reduced range of motion, nonfocal tenderness about entire wrist.  Patient has 2 out of 5 grip strength however normal neurological examination.  No overlying skin change.  Plain film images show possible scapholunate ligament injury with widening of the scapholunate interval.  Patient we placed in radial gutter splint and referred to orthopedics.  Patient provided pain medication here, 5 mg hydrocodone.  Patient will be written out of work.  Patient was given return precautions  and he voiced understanding.  Patient had all questions answered to his satisfaction.  Patient stable for discharge.   Final Clinical Impression(s) / ED Diagnoses Final diagnoses:  Scapholunate dissociation of left wrist    Rx / DC Orders ED Discharge Orders          Ordered    HYDROcodone-acetaminophen (NORCO/VICODIN) 5-325 MG tablet  Every 6 hours PRN        04/28/22 2143              Al Decant, PA-C 04/28/22 2259    Lonell Grandchild, MD 04/29/22 1520

## 2022-04-28 NOTE — Progress Notes (Signed)
Orthopedic Tech Progress Note Patient Details:  Gavin Bowen 1976-01-06 [id number]  Ortho Devices Type of Ortho Device: Rad Gutter splint Ortho Device/Splint Location: lue Ortho Device/Splint Interventions: Ordered, Application, Adjustment  I applied splint with extra padding . Post Interventions Patient Tolerated: Well Instructions Provided: Care of device, Adjustment of device  Karolee Stamps 04/28/2022, 9:52 PM

## 2022-07-22 ENCOUNTER — Ambulatory Visit
Admission: EM | Admit: 2022-07-22 | Discharge: 2022-07-22 | Disposition: A | Discharge status: Home / Self Care | Payer: Managed Care, Other (non HMO) | Attending: Internal Medicine | Admitting: Internal Medicine

## 2022-07-22 DIAGNOSIS — M25532 Pain in left wrist: Secondary | ICD-10-CM | POA: Diagnosis not present

## 2022-07-22 DIAGNOSIS — N644 Mastodynia: Secondary | ICD-10-CM

## 2022-07-22 DIAGNOSIS — M79671 Pain in right foot: Secondary | ICD-10-CM

## 2022-07-22 DIAGNOSIS — M79672 Pain in left foot: Secondary | ICD-10-CM

## 2022-07-22 MED ORDER — PREDNISONE 20 MG PO TABS: 40.0000 mg | ORAL_TABLET | Freq: Every day | ORAL | 0 refills | Status: AC

## 2022-07-22 NOTE — ED Notes (Addendum)
Patient educated to watch for increased swelling, pain, color changes (seek assistance if fingers/ arm turns purple or blue tinged), and loss of sensation in limb and when to seek help. Patient verbalizes understanding.

## 2022-07-22 NOTE — ED Triage Notes (Addendum)
Patient c/o left wrist pain (he states he was told his wrist was broken and he did have a cast that was placed about a year ago- removed early).    There is visible swelling to the left wrist.   The pt also c/o left and right foot pain.   Home interventions: Advil

## 2022-07-22 NOTE — ED Provider Notes (Signed)
[facility]    CSN: [id number] Arrival date & time: 07/22/22  1600      History   Chief Complaint Chief Complaint  Patient presents with   Wrist Pain    HPI Gavin Bowen is a 47 y.o. male.   Patient presents with several different chief complaints today.  Patient reports that he has had left wrist pain for "some time".  He was seen in the ED in April and diagnosed with scapholunate ligament injury of the left wrist.  He was placed in a radial gutter splint and advised to follow-up with Ortho.  He never followed up with Ortho and took the splint off himself.  Reports he has been taking Advil for pain.  Reports numbness and tingling throughout wrist.  Denies any obvious injury to the area.  Patient is also reporting bilateral heel pain that has been present for "a while".  Reports that he does a lot of prolonged standing at work.  Denies any injury to the area.  Denies any fever.  Reports standing and walking alleviates the pain but elevation makes it worse.  Also states that he has been having some right-sided nipple pain for about a week.  Denies any nipple discharge.  Denies any trauma to the area.  Reports that he noticed it when his son hugged him recently.  Denies any rash.  Denies any fever.   Wrist Pain    Past Medical History:  Diagnosis Date   Heart murmur     There are no problems to display for this patient.   Past Surgical History:  Procedure Laterality Date   right index finger surgery         Home Medications    Prior to Admission medications   Medication Sig Start Date End Date Taking? Authorizing Provider  predniSONE (DELTASONE) 20 MG tablet Take 2 tablets (40 mg total) by mouth daily for 5 days. 07/22/22 07/27/22 Yes Ivalene Platte, Acie Fredrickson, FNP  acetaminophen (TYLENOL) 500 MG tablet Take 1 tablet (500 mg total) by mouth every 6 (six) hours as needed. 10/13/20   Derwood Kaplan, MD  bacitracin ointment Apply 1 application topically 2 (two) times  daily. 10/13/20   Derwood Kaplan, MD  HYDROcodone-acetaminophen (NORCO/VICODIN) 5-325 MG tablet Take 1 tablet by mouth every 6 (six) hours as needed for severe pain. 04/28/22   Al Decant, PA-C  oxyCODONE-acetaminophen (PERCOCET/ROXICET) 5-325 MG tablet Take 1 tablet by mouth every 6 (six) hours as needed for severe pain. 10/13/20   Derwood Kaplan, MD    Family History History reviewed. No pertinent family history.  Social History Social History   Tobacco Use   Smoking status: Every Day    Packs/day: .22    Types: Cigarettes   Smokeless tobacco: Never  Vaping Use   Vaping Use: Never used  Substance Use Topics   Alcohol use: No   Drug use: Not Currently    Types: Cocaine     Allergies   Patient has no known allergies.   Review of Systems Review of Systems Per HPI  Physical Exam Triage Vital Signs ED Triage Vitals  Enc Vitals Group     BP 07/22/22 1619 (!) 146/93     Pulse Rate 07/22/22 1619 85     Resp 07/22/22 1619 18     Temp 07/22/22 1619 98.4 F (36.9 C)     Temp Source 07/22/22 1619 Oral     SpO2 07/22/22 1619 97 %     Weight --  Height --      Head Circumference --      Peak Flow --      Pain Score 07/22/22 1616 10     Pain Loc --      Pain Edu? --      Excl. in GC? --    No data found.  Updated Vital Signs BP (!) 146/93 (BP Location: Left Arm)   Pulse 85   Temp 98.4 F (36.9 C) (Oral)   Resp 18   SpO2 97%   Visual Acuity Right Eye Distance:   Left Eye Distance:   Bilateral Distance:    Right Eye Near:   Left Eye Near:    Bilateral Near:     Physical Exam Constitutional:      General: He is not in acute distress.    Appearance: Normal appearance. He is not toxic-appearing or diaphoretic.  HENT:     Head: Normocephalic and atraumatic.  Eyes:     Extraocular Movements: Extraocular movements intact.     Conjunctiva/sclera: Conjunctivae normal.  Pulmonary:     Effort: Pulmonary effort is normal.  Chest:     Comments:  Chest, breast, nipple appear normal with no swelling or inflammation. No palpable mass.  No nipple discharge noted. Musculoskeletal:     Comments: Patient has obvious area of mild swelling present overlying the distal radial portion of the left wrist.  No discoloration noted.  No warmth noted.  No abrasions or lacerations noted.  Limited range of motion of wrist due to pain.  No tenderness to palpation.  Grip strength is 4/5.  Appears neurovascularly intact.  Feet:     Comments: Has very mild tenderness to palpation to bilateral heels.  No obvious warmth, discoloration, protrusions, lesions, lacerations, abrasions noted.  No tenderness to remainder of foot.  Capillary refill and pulses intact.  Patient can wiggle toes and has full range of motion of ankles. Neurological:     General: No focal deficit present.     Mental Status: He is alert and oriented to person, place, and time. Mental status is at baseline.  Psychiatric:        Mood and Affect: Mood normal.        Behavior: Behavior normal.        Thought Content: Thought content normal.        Judgment: Judgment normal.      UC Treatments / Results  Labs (all labs ordered are listed, but only abnormal results are displayed) Labs Reviewed - No data to display  EKG   Radiology No results found.  Procedures Procedures (including critical care time)  Medications Ordered in UC Medications - No data to display  Initial Impression / Assessment and Plan / UC Course  I have reviewed the triage vital signs and the nursing notes.  Pertinent labs & imaging results that were available during my care of the patient were reviewed by me and considered in my medical decision making (see chart for details).     1.  Scapholunate ligament injury, left wrist pain  Patient had previous imaging in April that showed possible scapholunate ligament injury. Therefore, do not think any additional imaging is necessary.  Patient did not have proper  follow-up and removed wrist brace.  Spoke with Dr. Leonides Grills, supervising physician, who advised to replace radial gutter splint, treat with prednisone, and have patient follow-up with orthopedist.  Provided with orthopedist contact information for follow-up.  Advised supportive care as well.  2.  Bilateral heel pain  Possibly plantar fasciitis.  Advised supportive care and getting good arch support.  Advised following up with podiatrist at provided contact information for further evaluation and management.  3.  Nipple pain  No obvious signs of infection.  No palpable abnormality or mass.  Suspect possible irritant dermatitis of some sort. Patient would like imaging so breast imaging ordered and patient's information was faxed over to breast imaging center. Advised patient that if they do not contact him, he is to contact them himself at provided contact information. Also advised PCP follow up. Patient verbalized understanding and was agreeable with plan.  Coding this as a level 4 given multiple chief complaints addressed today.  Final Clinical Impressions(s) / UC Diagnoses   Final diagnoses:  Left wrist pain  Bilateral foot pain  Nipple pain     Discharge Instructions      Splint has been applied.  Wear this until otherwise advised by orthopedist.  Please call orthopedist on Monday to schedule appointment for further evaluation and management.  For your foot pain, please go to the shoe store and get good arch support.  Follow-up with podiatrist at provided contact information.  Your information has been sent over to the imaging center for your chest. If they do not call you, please call them yourself.      ED Prescriptions     Medication Sig Dispense Auth. Provider   predniSONE (DELTASONE) 20 MG tablet Take 2 tablets (40 mg total) by mouth daily for 5 days. 10 tablet Gustavus Bryant, [place]      PDMP not reviewed this encounter.   Gustavus Bryant, [place] 07/22/22 [phone]

## 2022-07-22 NOTE — Discharge Instructions (Addendum)
Splint has been applied.  Wear this until otherwise advised by orthopedist.  Please call orthopedist on Monday to schedule appointment for further evaluation and management.  For your foot pain, please go to the shoe store and get good arch support.  Follow-up with podiatrist at provided contact information.  Your information has been sent over to the imaging center for your chest. If they do not call you, please call them yourself.

## 2022-07-26 ENCOUNTER — Other Ambulatory Visit: Payer: Self-pay | Admitting: Internal Medicine

## 2022-07-26 DIAGNOSIS — N644 Mastodynia: Secondary | ICD-10-CM

## 2022-08-01 ENCOUNTER — Emergency Department (HOSPITAL_COMMUNITY)
Admission: EM | Admit: 2022-08-01 | Discharge: 2022-08-02 | Disposition: A | Discharge status: Home / Self Care | Payer: BC Managed Care – PPO | Attending: Emergency Medicine | Admitting: Emergency Medicine

## 2022-08-01 ENCOUNTER — Other Ambulatory Visit: Payer: Self-pay

## 2022-08-01 DIAGNOSIS — F1213 Cannabis abuse with withdrawal: Secondary | ICD-10-CM | POA: Insufficient documentation

## 2022-08-01 DIAGNOSIS — F1123 Opioid dependence with withdrawal: Secondary | ICD-10-CM | POA: Diagnosis not present

## 2022-08-01 DIAGNOSIS — D72829 Elevated white blood cell count, unspecified: Secondary | ICD-10-CM | POA: Diagnosis not present

## 2022-08-01 DIAGNOSIS — F1910 Other psychoactive substance abuse, uncomplicated: Secondary | ICD-10-CM | POA: Insufficient documentation

## 2022-08-01 LAB — RAPID URINE DRUG SCREEN, HOSP PERFORMED
Amphetamines: NOT DETECTED
Barbiturates: NOT DETECTED
Benzodiazepines: NOT DETECTED
Cocaine: NOT DETECTED
Opiates: POSITIVE — AB
Tetrahydrocannabinol: POSITIVE — AB

## 2022-08-01 LAB — COMPREHENSIVE METABOLIC PANEL
ALT: 14 U/L (ref 0–44)
AST: 18 U/L (ref 15–41)
Albumin: 4.5 g/dL (ref 3.5–5.0)
Alkaline Phosphatase: 68 U/L (ref 38–126)
Anion gap: 11 (ref 5–15)
BUN: 8 mg/dL (ref 6–20)
CO2: 24 mmol/L (ref 22–32)
Calcium: 9.7 mg/dL (ref 8.9–10.3)
Chloride: 105 mmol/L (ref 98–111)
Creatinine, Ser: 0.85 mg/dL (ref 0.61–1.24)
GFR, Estimated: >60 mL/min (ref >60)
Glucose, Bld: 126 mg/dL — ABNORMAL HIGH (ref 70–99)
Potassium: 3.5 mmol/L (ref 3.5–5.1)
Sodium: 140 mmol/L (ref 135–145)
Total Bilirubin: 1.4 mg/dL — ABNORMAL HIGH (ref 0.3–1.2)
Total Protein: 8.6 g/dL — ABNORMAL HIGH (ref 6.5–8.1)

## 2022-08-01 LAB — CBC WITH DIFFERENTIAL/PLATELET
Abs Immature Granulocytes: 0.04 10*3/uL (ref 0.00–0.07)
Basophils Absolute: 0 10*3/uL (ref 0.0–0.1)
Basophils Relative: 0 %
Eosinophils Absolute: 0 10*3/uL (ref 0.0–0.5)
Eosinophils Relative: 0 %
HCT: 40.7 % (ref 39.0–52.0)
Hemoglobin: 14.5 g/dL (ref 13.0–17.0)
Immature Granulocytes: 0 %
Lymphocytes Relative: 18 %
Lymphs Abs: 2.4 10*3/uL (ref 0.7–4.0)
MCH: 29.4 pg (ref 26.0–34.0)
MCHC: 35.6 g/dL (ref 30.0–36.0)
MCV: 82.4 fL (ref 80.0–100.0)
Monocytes Absolute: 0.7 10*3/uL (ref 0.1–1.0)
Monocytes Relative: 5 %
Neutro Abs: 10.1 10*3/uL — ABNORMAL HIGH (ref 1.7–7.7)
Neutrophils Relative %: 77 %
Platelets: 313 10*3/uL (ref 150–400)
RBC: 4.94 MIL/uL (ref 4.22–5.81)
RDW: 12.4 % (ref 11.5–15.5)
WBC: 13.2 10*3/uL — ABNORMAL HIGH (ref 4.0–10.5)
nRBC: 0 % (ref 0.0–0.2)

## 2022-08-01 LAB — ETHANOL: Alcohol, Ethyl (B): <10 mg/dL (ref <10)

## 2022-08-01 NOTE — ED Triage Notes (Signed)
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 Pt last used yesterday. He states that his symptoms are inability to sleep, eat and drink. He wants to talk to someone about inpatient detox. Denies ETOH use.

## 2022-08-02 MED ORDER — DICYCLOMINE HCL 20 MG PO TABS: 20.0000 mg | ORAL_TABLET | Freq: Two times a day (BID) | ORAL | 0 refills | Status: DC

## 2022-08-02 MED ORDER — ONDANSETRON 4 MG PO TBDP: 4.0000 mg | ORAL_TABLET | Freq: Three times a day (TID) | ORAL | 0 refills | Status: DC | PRN

## 2022-08-02 MED ORDER — METHOCARBAMOL 500 MG PO TABS
500.0000 mg | ORAL_TABLET | Freq: Once | ORAL | Status: AC
  Administered 2022-08-02: 500 mg via ORAL
  Filled 2022-08-02: qty 1

## 2022-08-02 MED ORDER — DEXAMETHASONE SODIUM PHOSPHATE 10 MG/ML IJ SOLN
10.0000 mg | Freq: Once | INTRAMUSCULAR | Status: AC
  Administered 2022-08-02: 10 mg via INTRAVENOUS
  Filled 2022-08-02: qty 1

## 2022-08-02 MED ORDER — DICYCLOMINE HCL 10 MG PO CAPS
10.0000 mg | ORAL_CAPSULE | Freq: Once | ORAL | Status: AC
  Administered 2022-08-02: 10 mg via ORAL
  Filled 2022-08-02: qty 1

## 2022-08-02 MED ORDER — SODIUM CHLORIDE 0.9 % IV BOLUS
1000.0000 mL | Freq: Once | INTRAVENOUS | Status: AC
  Administered 2022-08-02: 1000 mL via INTRAVENOUS

## 2022-08-02 MED ORDER — METHOCARBAMOL 500 MG PO TABS: 500.0000 mg | ORAL_TABLET | Freq: Two times a day (BID) | ORAL | 0 refills | Status: DC

## 2022-08-02 NOTE — ED Provider Notes (Signed)
[place] EMERGENCY DEPARTMENT AT [facility] Provider Note   CSN: [id number] Arrival date & time: 08/01/22  2012     History  Chief Complaint  Patient presents with   Insomnia   Medical Clearance    BREWER HITCHMAN is a 47 y.o. male.  47 year old male presents with request for symptom management from withdrawal from fentanyl and heroin.  Reports snorting as well as putting the drugs into his mouth for use, denies IV use.  Onset 8 months ago, fell out at work today and recognizes that he has a problem with drug use and presents the ER with request for symptom management.  He did contact an inpatient facility in [place] however notes the program is 30 days and he is unable to step away from his job and family for 30 days for treatment.  Reports his legs feel restless, he is having difficulty walking, headache and diarrhea.  Denies alcohol use or other drug use.  Although notes he may be positive for marijuana.       Home Medications Prior to Admission medications   Medication Sig Start Date End Date Taking? Authorizing Provider  dicyclomine (BENTYL) 20 MG tablet Take 1 tablet (20 mg total) by mouth 2 (two) times daily. 08/02/22  Yes Jeannie Fend, PA-C  methocarbamol (ROBAXIN) 500 MG tablet Take 1 tablet (500 mg total) by mouth 2 (two) times daily. 08/02/22  Yes Jeannie Fend, PA-C  ondansetron (ZOFRAN-ODT) 4 MG disintegrating tablet Take 1 tablet (4 mg total) by mouth every 8 (eight) hours as needed for nausea or vomiting. 08/02/22  Yes Jeannie Fend, PA-C  acetaminophen (TYLENOL) 500 MG tablet Take 1 tablet (500 mg total) by mouth every 6 (six) hours as needed. Patient not taking: Reported on 08/02/2022 10/13/20   Derwood Kaplan, MD  bacitracin ointment Apply 1 application topically 2 (two) times daily. Patient not taking: Reported on 08/02/2022 10/13/20   Derwood Kaplan, MD  HYDROcodone-acetaminophen (NORCO/VICODIN) 5-325 MG tablet Take 1 tablet by mouth every 6  (six) hours as needed for severe pain. Patient not taking: Reported on 08/02/2022 04/28/22   Al Decant, PA-C  oxyCODONE-acetaminophen (PERCOCET/ROXICET) 5-325 MG tablet Take 1 tablet by mouth every 6 (six) hours as needed for severe pain. Patient not taking: Reported on 08/02/2022 10/13/20   Derwood Kaplan, MD      Allergies    Patient has no known allergies.    Review of Systems   Review of Systems Negative except as per HPI Physical Exam Updated Vital Signs BP (!) 150/100 (BP Location: Left Arm)   Pulse (!) 55   Temp 97.8 F (36.6 C) (Oral)   Resp 20   Ht 6\' 2"  (1.88 m)   Wt 81.6 kg   SpO2 100%   BMI 23.11 kg/m  Physical Exam Vitals and nursing note reviewed.  Constitutional:      General: He is not in acute distress.    Appearance: He is well-developed. He is not diaphoretic.  HENT:     Head: Normocephalic and atraumatic.     Mouth/Throat:     Mouth: Mucous membranes are moist.  Eyes:     Extraocular Movements: Extraocular movements intact.     Pupils: Pupils are equal, round, and reactive to light.  Cardiovascular:     Rate and Rhythm: Normal rate and regular rhythm.     Heart sounds: Normal heart sounds.  Pulmonary:     Effort: Pulmonary effort is normal.  Breath sounds: Normal breath sounds.  Abdominal:     Palpations: Abdomen is soft.     Tenderness: There is no abdominal tenderness.  Musculoskeletal:     Cervical back: Neck supple.     Right lower leg: No edema.     Left lower leg: No edema.  Skin:    General: Skin is warm and dry.  Neurological:     Mental Status: He is alert and oriented to person, place, and time.  Psychiatric:        Behavior: Behavior normal.     ED Results / Procedures / Treatments   Labs (all labs ordered are listed, but only abnormal results are displayed) Labs Reviewed  COMPREHENSIVE METABOLIC PANEL - Abnormal; Notable for the following components:      Result Value   Glucose, Bld 126 (*)    Total Protein 8.6  (*)    Total Bilirubin 1.4 (*)    All other components within normal limits  RAPID URINE DRUG SCREEN, HOSP PERFORMED - Abnormal; Notable for the following components:   Opiates POSITIVE (*)    Tetrahydrocannabinol POSITIVE (*)    All other components within normal limits  CBC WITH DIFFERENTIAL/PLATELET - Abnormal; Notable for the following components:   WBC 13.2 (*)    Neutro Abs 10.1 (*)    All other components within normal limits  ETHANOL    EKG None  Radiology No results found.  Procedures Procedures    Medications Ordered in ED Medications  sodium chloride 0.9 % bolus 1,000 mL (1,000 mLs Intravenous New Bag/Given 08/02/22 0409)  dexamethasone (DECADRON) injection 10 mg (10 mg Intravenous Given 08/02/22 0425)  methocarbamol (ROBAXIN) tablet 500 mg (500 mg Oral Given 08/02/22 0426)  dicyclomine (BENTYL) capsule 10 mg (10 mg Oral Given 08/02/22 0426)    ED Course/ Medical Decision Making/ A&P                             Medical Decision Making Amount and/or Complexity of Data Reviewed Labs: ordered.  Risk Prescription drug management.   This patient presents to the ED for concern of withdrawal from fentanyl and heroin, this involves an extensive number of treatment options, and is a complaint that carries with it a high risk of complications and morbidity.  The differential diagnosis includes but not limited to polysubstance abuse, metabolic abnormality    Co morbidities that complicate the patient evaluation  Heart murmur, history of prior substance abuse.   Additional history obtained:  External records from outside source obtained and reviewed including labs on file for comparison    Lab Tests:  I Ordered, and personally interpreted labs.  The pertinent results include: CBC with nonspecific leukocytosis at 13.2 with increased neutrophils.  Alcohol is negative.  CMP without significant findings, nonfasting.  UDS positive for opiates and marijuana.   Cardiac  Monitoring: / EKG:  The patient was maintained on a cardiac monitor.  I personally viewed and interpreted the cardiac monitored which showed an underlying rhythm of: Sinus rhythm, rate 74    Problem List / ED Course / Critical interventions / Medication management  46 year old male presents with concern for withdrawal from heroin and fentanyl.  Provided with symptom relief, patient improved states he was able to sleep.  He is provided with outpatient resources and prescription for Robaxin, Bentyl, Zofran. I ordered medication including IV fluids, Decadron, Robaxin, Bentyl for abdominal cramping, diarrhea, leg spasms, headache Reevaluation of the  patient after these medicines showed that the patient improved I have reviewed the patients home medicines and have made adjustments as needed   Social Determinants of Health:  No PCP on file   Test / Admission - Considered:  Stable for discharge for outpatient follow-up         Final Clinical Impression(s) / ED Diagnoses Final diagnoses:  Polysubstance abuse (HCC)    Rx / DC Orders ED Discharge Orders          Ordered    methocarbamol (ROBAXIN) 500 MG tablet  2 times daily        08/02/22 0521    ondansetron (ZOFRAN-ODT) 4 MG disintegrating tablet  Every 8 hours PRN        08/02/22 0521    dicyclomine (BENTYL) 20 MG tablet  2 times daily        08/02/22 0521              Jeannie Fend, PA-C 08/02/22 0532    Gilda Crease, MD 08/02/22 [phone]

## 2022-08-02 NOTE — Discharge Instructions (Addendum)
Medications as prescribed to manage withdrawal symptoms.  Recheck with your primary care provider.  Can also use behavioral health urgent care as needed.  Please see resource guide for local outpatient and inpatient treatment centers.

## 2022-08-05 ENCOUNTER — Ambulatory Visit
Admission: EM | Admit: 2022-08-05 | Discharge: 2022-08-05 | Disposition: A | Discharge status: Home / Self Care | Payer: Managed Care, Other (non HMO) | Attending: Family Medicine | Admitting: Family Medicine

## 2022-08-05 DIAGNOSIS — F19939 Other psychoactive substance use, unspecified with withdrawal, unspecified: Secondary | ICD-10-CM

## 2022-08-05 DIAGNOSIS — F4323 Adjustment disorder with mixed anxiety and depressed mood: Secondary | ICD-10-CM

## 2022-08-05 DIAGNOSIS — R52 Pain, unspecified: Secondary | ICD-10-CM | POA: Diagnosis not present

## 2022-08-05 DIAGNOSIS — F5104 Psychophysiologic insomnia: Secondary | ICD-10-CM

## 2022-08-05 MED ORDER — HYDROXYZINE HCL 50 MG PO TABS: 50.0000 mg | ORAL_TABLET | Freq: Every evening | ORAL | 0 refills | Status: DC | PRN

## 2022-08-05 MED ORDER — CYCLOBENZAPRINE HCL 10 MG PO TABS: 10.0000 mg | ORAL_TABLET | Freq: Two times a day (BID) | ORAL | 0 refills | Status: DC | PRN

## 2022-08-05 NOTE — ED Provider Notes (Signed)
[facility]    CSN: [id number] Arrival date & time: 08/05/22  1553      History   Chief Complaint Chief Complaint  Patient presents with   Insomnia   Pain    HPI Gavin Bowen is a 47 y.o. male.   HPI Patent history of polysubstance use presents today complaining of generalized body pain and worsening insomnia since being to withdraw from substance use independently at home. Patient seen at the emergency department on 08/02/2022 requesting treatment due to withdrawal symptoms. UDS positive for opiates and THC. Patient reports that he became addicted to opiates a couple years ago after being prescribed medication for pain subsequently began abusing opiates other street drugs.  He reports the beginning of July he came to the realization that he wanted to get off drugs and get control of his mental health and his overall life.  He reports that he wanted to withdrawal from substances on his own opposed to going into a treatment program as he works full-time. Reports he is continue to have generalized body ache shakes and an inability to sleep.  He was prescribed dicyclomine, Robaxin, Zofran during his recent ER visit on 08/02/2022.  Patient also expressed that he did have some passive SI over the last 30 days although is not currently having any suicidal ideations and denies any thoughts or plans of hurting anyone else.  Patient is requesting some assistance in getting a primary care provider along with the mental health provider as he feels like if he can get his depression and anxiety under control this will help with his of remaining substance free. Past Medical History:  Diagnosis Date   Heart murmur     There are no problems to display for this patient.   Past Surgical History:  Procedure Laterality Date   right index finger surgery         Home Medications    Prior to Admission medications   Medication Sig Start Date End Date Taking? Authorizing Provider   cyclobenzaprine (FLEXERIL) 10 MG tablet Take 1 tablet (10 mg total) by mouth 2 (two) times daily as needed (Body aches and pain). 08/05/22  Yes Bing Neighbors, NP  hydrOXYzine (ATARAX) 50 MG tablet Take 1 tablet (50 mg total) by mouth at bedtime as needed. 08/05/22  Yes Bing Neighbors, NP  acetaminophen (TYLENOL) 500 MG tablet Take 1 tablet (500 mg total) by mouth every 6 (six) hours as needed. Patient not taking: Reported on 08/02/2022 10/13/20   Derwood Kaplan, MD  bacitracin ointment Apply 1 application topically 2 (two) times daily. Patient not taking: Reported on 08/02/2022 10/13/20   Derwood Kaplan, MD  dicyclomine (BENTYL) 20 MG tablet Take 1 tablet (20 mg total) by mouth 2 (two) times daily. 08/02/22   Jeannie Fend, PA-C  HYDROcodone-acetaminophen (NORCO/VICODIN) 5-325 MG tablet Take 1 tablet by mouth every 6 (six) hours as needed for severe pain. Patient not taking: Reported on 08/02/2022 04/28/22   Al Decant, PA-C  methocarbamol (ROBAXIN) 500 MG tablet Take 1 tablet (500 mg total) by mouth 2 (two) times daily. 08/02/22   Jeannie Fend, PA-C  ondansetron (ZOFRAN-ODT) 4 MG disintegrating tablet Take 1 tablet (4 mg total) by mouth every 8 (eight) hours as needed for nausea or vomiting. 08/02/22   Jeannie Fend, PA-C  oxyCODONE-acetaminophen (PERCOCET/ROXICET) 5-325 MG tablet Take 1 tablet by mouth every 6 (six) hours as needed for severe pain. Patient not taking: Reported on 08/02/2022 10/13/20  Derwood Kaplan, MD    Family History No family history on file.  Social History Social History   Tobacco Use   Smoking status: Every Day    Current packs/day: 0.22    Types: Cigarettes   Smokeless tobacco: Never  Vaping Use   Vaping status: Never Used  Substance Use Topics   Alcohol use: No   Drug use: Not Currently    Types: Cocaine     Allergies   Patient has no known allergies.   Review of Systems Review of Systems Pertinent negatives listed in HPI   Physical  Exam Triage Vital Signs ED Triage Vitals  Encounter Vitals Group     BP 08/05/22 1601 119/86     Systolic BP Percentile --      Diastolic BP Percentile --      Pulse Rate 08/05/22 1601 (!) 109     Resp 08/05/22 1601 16     Temp 08/05/22 1601 98.1 F (36.7 C)     Temp Source 08/05/22 1601 Oral     SpO2 08/05/22 1601 98 %     Weight --      Height --      Head Circumference --      Peak Flow --      Pain Score 08/05/22 1609 10     Pain Loc --      Pain Education --      Exclude from Growth Chart --    No data found.  Updated Vital Signs BP 119/86   Pulse (!) 109   Temp 98.1 F (36.7 C) (Oral)   Resp 16   SpO2 98%   Visual Acuity Right Eye Distance:   Left Eye Distance:   Bilateral Distance:    Right Eye Near:   Left Eye Near:    Bilateral Near:     Physical Exam Vitals and nursing note reviewed.  HENT:     Head: Normocephalic and atraumatic.  Eyes:     Extraocular Movements: Extraocular movements intact.     Conjunctiva/sclera: Conjunctivae normal.     Pupils: Pupils are equal, round, and reactive to light.  Cardiovascular:     Rate and Rhythm: Normal rate and regular rhythm.  Pulmonary:     Effort: Pulmonary effort is normal.     Breath sounds: Normal breath sounds.  Neurological:     General: No focal deficit present.     Mental Status: He is alert.  Psychiatric:        Attention and Perception: Attention normal.        Mood and Affect: Mood is anxious. Affect is tearful.        Speech: Speech normal.        Behavior: Behavior normal. Behavior is cooperative.        Thought Content: Thought content normal.        Cognition and Memory: Cognition normal.        Judgment: Judgment normal.     UC Treatments / Results  Labs (all labs ordered are listed, but only abnormal results are displayed) Labs Reviewed - No data to display  EKG   Radiology No results found.  Procedures Procedures (including critical care time)  Medications Ordered in  UC Medications - No data to display  Initial Impression / Assessment and Plan / UC Course  I have reviewed the triage vital signs and the nursing notes.  Pertinent labs & imaging results that were available during my care of the patient  were reviewed by me and considered in my medical decision making (see chart for details).    Patient endorses ongoing cravings and withdrawal symptoms of generalized bodyaches and problems with sleeping, referral placed to [facility] outpatient [facility]ry to help with mental health needs.  Prescribed patient cyclobenzaprine to help with bodyaches and hydroxyzine 50 mg at bedtime for sleep.  Patient also requested a work note excusing him from work until Monday.  Patient given resources to [facility] if any crisis develops.   Final Clinical Impressions(s) / UC Diagnoses   Final diagnoses:  Withdrawal from recreational drug (HCC)  Generalized body aches  Psychophysiological insomnia  Adjustment disorder with mixed anxiety and depressed mood     Discharge Instructions      If you develop any thoughts of killing yourself, harming anyone else, or any intrusive thoughts go immediately to [facility] for crisis management.        ED Prescriptions     Medication Sig Dispense Auth. Provider   cyclobenzaprine (FLEXERIL) 10 MG tablet Take 1 tablet (10 mg total) by mouth 2 (two) times daily as needed (Body aches and pain). 20 tablet Bing Neighbors, NP   hydrOXYzine (ATARAX) 50 MG tablet Take 1 tablet (50 mg total) by mouth at bedtime as needed. 20 tablet Bing Neighbors, NP      PDMP not reviewed this encounter.   Bing Neighbors, NP 08/05/22 [phone]

## 2022-08-05 NOTE — ED Triage Notes (Signed)
Pt c/o chronic pain "from past injuries", insomnia. States has been clean x approx 2 wks.  Triage eval per provider.

## 2022-08-05 NOTE — Discharge Instructions (Addendum)
If you develop any thoughts of killing yourself, harming anyone else, or any intrusive thoughts go immediately to [facility] for crisis management.

## 2022-10-19 ENCOUNTER — Ambulatory Visit: Payer: Managed Care, Other (non HMO) | Admitting: Family Medicine

## 2022-12-29 ENCOUNTER — Ambulatory Visit: Payer: Self-pay | Admitting: Family Medicine

## 2023-01-28 ENCOUNTER — Emergency Department (HOSPITAL_COMMUNITY)
Admission: EM | Admit: 2023-01-28 | Discharge: 2023-01-28 | Disposition: A | Discharge status: Home / Self Care | Payer: BC Managed Care – PPO | Attending: Emergency Medicine | Admitting: Emergency Medicine

## 2023-01-28 ENCOUNTER — Other Ambulatory Visit: Payer: Self-pay

## 2023-01-28 ENCOUNTER — Emergency Department (HOSPITAL_COMMUNITY): Payer: BC Managed Care – PPO

## 2023-01-28 ENCOUNTER — Encounter (HOSPITAL_COMMUNITY): Payer: Self-pay

## 2023-01-28 DIAGNOSIS — W11XXXA Fall on and from ladder, initial encounter: Secondary | ICD-10-CM | POA: Insufficient documentation

## 2023-01-28 DIAGNOSIS — S299XXA Unspecified injury of thorax, initial encounter: Secondary | ICD-10-CM | POA: Diagnosis present

## 2023-01-28 DIAGNOSIS — S20219A Contusion of unspecified front wall of thorax, initial encounter: Secondary | ICD-10-CM | POA: Insufficient documentation

## 2023-01-28 DIAGNOSIS — S20211A Contusion of right front wall of thorax, initial encounter: Secondary | ICD-10-CM

## 2023-01-28 MED ORDER — OXYCODONE-ACETAMINOPHEN 5-325 MG PO TABS: 1.0000 | ORAL_TABLET | Freq: Once | ORAL | Status: DC

## 2023-01-28 MED ORDER — IBUPROFEN 600 MG PO TABS: 600.0000 mg | ORAL_TABLET | Freq: Four times a day (QID) | ORAL | 0 refills | Status: DC | PRN

## 2023-01-28 MED ORDER — IBUPROFEN 200 MG PO TABS
600.0000 mg | ORAL_TABLET | Freq: Once | ORAL | Status: AC
  Administered 2023-01-28: 600 mg via ORAL
  Filled 2023-01-28: qty 3

## 2023-01-28 NOTE — Discharge Instructions (Signed)
 There is no displaced rib fracture or lung injury seen on the xray, suggesting bruising of the chest wall. Take ibuprofen as prescribed. Follow up with your doctor if pain persists. Return to the ED if pain worsens.

## 2023-01-28 NOTE — ED Notes (Signed)
 Discharge instructions reviewed.   Newly prescribed medications discussed. Pharmacy verified.   Opportunity for questions and concerns provided.   Alert, oriented and ambulatory to [facility]. Declined discharge vital signs.

## 2023-01-28 NOTE — ED Provider Notes (Signed)
 [place] EMERGENCY DEPARTMENT AT [facility] Provider Note   CSN: [id number] Arrival date & time: 01/28/23  1759     History  No chief complaint on file.   Gavin Bowen is a 48 y.o. male.  Patient to ED with right lower chest wall pain since fall about 5 days ago from ladder onto garden border wall. Reports pain in the right lateral chest wall that has persisted. Not anticoagulated. No abdominal pain.         Home Medications Prior to Admission medications   Medication Sig Start Date End Date Taking? Authorizing Provider  ibuprofen  (ADVIL ) 600 MG tablet Take 1 tablet (600 mg total) by mouth every 6 (six) hours as needed. 01/28/23  Yes Odell Balls, PA-C  acetaminophen  (TYLENOL ) 500 MG tablet Take 1 tablet (500 mg total) by mouth every 6 (six) hours as needed. Patient not taking: Reported on 08/02/2022 10/13/20   Charlyn Sora, MD  bacitracin  ointment Apply 1 application topically 2 (two) times daily. Patient not taking: Reported on 08/02/2022 10/13/20   Charlyn Sora, MD  cyclobenzaprine  (FLEXERIL ) 10 MG tablet Take 1 tablet (10 mg total) by mouth 2 (two) times daily as needed (Body aches and pain). 08/05/22   Arloa Suzen RAMAN, NP  dicyclomine  (BENTYL ) 20 MG tablet Take 1 tablet (20 mg total) by mouth 2 (two) times daily. 08/02/22   Beverley Leita LABOR, PA-C  HYDROcodone -acetaminophen  (NORCO/VICODIN) 5-325 MG tablet Take 1 tablet by mouth every 6 (six) hours as needed for severe pain. Patient not taking: Reported on 08/02/2022 04/28/22   Groce, Christopher F, PA-C  hydrOXYzine  (ATARAX ) 50 MG tablet Take 1 tablet (50 mg total) by mouth at bedtime as needed. 08/05/22   Arloa Suzen RAMAN, NP  methocarbamol  (ROBAXIN ) 500 MG tablet Take 1 tablet (500 mg total) by mouth 2 (two) times daily. 08/02/22   Beverley Leita LABOR, PA-C  ondansetron  (ZOFRAN -ODT) 4 MG disintegrating tablet Take 1 tablet (4 mg total) by mouth every 8 (eight) hours as needed for nausea or vomiting. 08/02/22   Beverley Leita LABOR, PA-C  oxyCODONE -acetaminophen  (PERCOCET/ROXICET) 5-325 MG tablet Take 1 tablet by mouth every 6 (six) hours as needed for severe pain. Patient not taking: Reported on 08/02/2022 10/13/20   Nanavati, Ankit, MD      Allergies    Patient has no known allergies.    Review of Systems   Review of Systems  Physical Exam Updated Vital Signs BP (!) 154/98 (BP Location: Left Arm)   Pulse (!) 102   Temp 98 F (36.7 C) (Oral)   Resp 20   Ht 6' 2 (1.88 m)   Wt 104.3 kg   SpO2 99%   BMI 29.53 kg/m  Physical Exam Vitals and nursing note reviewed.  Constitutional:      Appearance: He is well-developed.  HENT:     Head: Normocephalic.  Cardiovascular:     Rate and Rhythm: Normal rate and regular rhythm.  Pulmonary:     Effort: Pulmonary effort is normal.     Breath sounds: Normal breath sounds. No wheezing, rhonchi or rales.       Comments: No bruising.  Chest:     Chest wall: Tenderness present.  Abdominal:     Palpations: Abdomen is soft.     Tenderness: There is no abdominal tenderness.  Musculoskeletal:        General: Normal range of motion.     Cervical back: Normal range of motion and neck supple.  Skin:  General: Skin is warm and dry.  Neurological:     General: No focal deficit present.     Mental Status: He is alert and oriented to person, place, and time.     ED Results / Procedures / Treatments   Labs (all labs ordered are listed, but only abnormal results are displayed) Labs Reviewed - No data to display  EKG None  Radiology DG Ribs Unilateral W/Chest Right Result Date: 01/28/2023 CLINICAL DATA:  Posterior right lower rib pain after fall off a ladder EXAM: RIGHT RIBS AND CHEST - 3+ VIEW COMPARISON:  10/13/2020 FINDINGS: Normal cardiomediastinal silhouette. No focal consolidation, pleural effusion, or pneumothorax. No displaced rib fractures. Chronic right humeral head fracture. IMPRESSION: No displaced rib fractures. Electronically Signed   By: Norman Gatlin M.D.   On: 01/28/2023 19:45    Procedures Procedures    Medications Ordered in ED Medications  ibuprofen  (ADVIL ) tablet 600 mg (has no administration in time range)    ED Course/ Medical Decision Making/ A&P Clinical Course as of 01/28/23 1955  Sat Jan 28, 2023  1931 Fall 5 days ago from height, c/o right chest wall pain. Hurts to breathe but denies SOB, cough. No abdominal pain, abdomen is nontender, specifically nontender in the RUQ.  [SU]  1955 CXR negative for acute injury. Suspect chest wall pain requiring supportive care.  [SU]    Clinical Course User Index [SU] Odell Balls, PA-C                                 Medical Decision Making Amount and/or Complexity of Data Reviewed Radiology: ordered.  Risk OTC drugs. Prescription drug management.           Final Clinical Impression(s) / ED Diagnoses Final diagnoses:  Contusion of right chest wall, initial encounter    Rx / DC Orders ED Discharge Orders          Ordered    ibuprofen  (ADVIL ) 600 MG tablet  Every 6 hours PRN        01/28/23 1940              Odell Balls, PA-C 01/28/23 1955    Horton, Roxie HERO, DO 01/28/23 2240

## 2023-01-28 NOTE — ED Triage Notes (Signed)
 Pt arrives via POV. Pt reports he fell 5 days ago while taking Christmas light down. Pt reports he was about 10 feet up the ladder when he fell. Pt landed on his right side. C/O right sided rib pain and lower back pain. Pt is AxOx4. Did not hit his head, no loc, and no blood thinners.

## 2024-02-26 ENCOUNTER — Encounter (HOSPITAL_BASED_OUTPATIENT_CLINIC_OR_DEPARTMENT_OTHER): Payer: Self-pay

## 2024-02-26 ENCOUNTER — Other Ambulatory Visit: Payer: Self-pay

## 2024-02-26 ENCOUNTER — Inpatient Hospital Stay (HOSPITAL_BASED_OUTPATIENT_CLINIC_OR_DEPARTMENT_OTHER)
Admission: EM | Admit: 2024-02-26 | Discharge: 2024-03-01 | Disposition: A | Source: Home / Self Care | Attending: Internal Medicine | Admitting: Internal Medicine

## 2024-02-26 ENCOUNTER — Emergency Department (HOSPITAL_BASED_OUTPATIENT_CLINIC_OR_DEPARTMENT_OTHER)

## 2024-02-26 ENCOUNTER — Emergency Department (HOSPITAL_COMMUNITY)

## 2024-02-26 DIAGNOSIS — W19XXXA Unspecified fall, initial encounter: Secondary | ICD-10-CM

## 2024-02-26 DIAGNOSIS — T796XXA Traumatic ischemia of muscle, initial encounter: Secondary | ICD-10-CM

## 2024-02-26 DIAGNOSIS — R29898 Other symptoms and signs involving the musculoskeletal system: Principal | ICD-10-CM

## 2024-02-26 DIAGNOSIS — M542 Cervicalgia: Secondary | ICD-10-CM | POA: Diagnosis not present

## 2024-02-26 DIAGNOSIS — F1910 Other psychoactive substance abuse, uncomplicated: Secondary | ICD-10-CM | POA: Diagnosis not present

## 2024-02-26 DIAGNOSIS — R55 Syncope and collapse: Secondary | ICD-10-CM | POA: Diagnosis not present

## 2024-02-26 DIAGNOSIS — E876 Hypokalemia: Secondary | ICD-10-CM | POA: Insufficient documentation

## 2024-02-26 DIAGNOSIS — R7989 Other specified abnormal findings of blood chemistry: Secondary | ICD-10-CM | POA: Insufficient documentation

## 2024-02-26 DIAGNOSIS — M6282 Rhabdomyolysis: Principal | ICD-10-CM | POA: Diagnosis present

## 2024-02-26 DIAGNOSIS — R569 Unspecified convulsions: Secondary | ICD-10-CM | POA: Diagnosis not present

## 2024-02-26 DIAGNOSIS — N179 Acute kidney failure, unspecified: Secondary | ICD-10-CM

## 2024-02-26 DIAGNOSIS — T148XXA Other injury of unspecified body region, initial encounter: Secondary | ICD-10-CM | POA: Insufficient documentation

## 2024-02-26 LAB — CBC WITH DIFFERENTIAL/PLATELET
Abs Immature Granulocytes: 0.03 10*3/uL (ref 0.00–0.07)
Basophils Absolute: 0 10*3/uL (ref 0.0–0.1)
Basophils Relative: 0 %
Eosinophils Absolute: 0 10*3/uL (ref 0.0–0.5)
Eosinophils Relative: 0 %
HCT: 41.4 % (ref 39.0–52.0)
Hemoglobin: 15.5 g/dL (ref 13.0–17.0)
Immature Granulocytes: 0 %
Lymphocytes Relative: 17 %
Lymphs Abs: 1.9 10*3/uL (ref 0.7–4.0)
MCH: 31.4 pg (ref 26.0–34.0)
MCHC: 37.4 g/dL — ABNORMAL HIGH (ref 30.0–36.0)
MCV: 83.8 fL (ref 80.0–100.0)
Monocytes Absolute: 1 10*3/uL (ref 0.1–1.0)
Monocytes Relative: 9 %
Neutro Abs: 8.3 10*3/uL — ABNORMAL HIGH (ref 1.7–7.7)
Neutrophils Relative %: 74 %
Platelets: 251 10*3/uL (ref 150–400)
RBC: 4.94 MIL/uL (ref 4.22–5.81)
RDW: 13 % (ref 11.5–15.5)
WBC: 11.2 10*3/uL — ABNORMAL HIGH (ref 4.0–10.5)
nRBC: 0 % (ref 0.0–0.2)

## 2024-02-26 LAB — HEPATITIS PANEL, ACUTE
HCV Ab: NONREACTIVE
Hep A IgM: NONREACTIVE
Hep B C IgM: NONREACTIVE
Hepatitis B Surface Ag: NONREACTIVE

## 2024-02-26 LAB — URINE DRUG SCREEN
Amphetamines: NEGATIVE
Barbiturates: NEGATIVE
Benzodiazepines: NEGATIVE
Cocaine: POSITIVE — AB
Fentanyl: POSITIVE — AB
Methadone Scn, Ur: NEGATIVE
Opiates: NEGATIVE
Tetrahydrocannabinol: NEGATIVE

## 2024-02-26 LAB — PRO BRAIN NATRIURETIC PEPTIDE: Pro Brain Natriuretic Peptide: <50 pg/mL

## 2024-02-26 LAB — COMPREHENSIVE METABOLIC PANEL WITH GFR
ALT: 264 U/L — ABNORMAL HIGH (ref 0–44)
AST: 766 U/L — ABNORMAL HIGH (ref 15–41)
Albumin: 4.1 g/dL (ref 3.5–5.0)
Alkaline Phosphatase: 77 U/L (ref 38–126)
Anion gap: 11 (ref 5–15)
BUN: 21 mg/dL — ABNORMAL HIGH (ref 6–20)
CO2: 29 mmol/L (ref 22–32)
Calcium: 9.5 mg/dL (ref 8.9–10.3)
Chloride: 95 mmol/L — ABNORMAL LOW (ref 98–111)
Creatinine, Ser: 1.9 mg/dL — ABNORMAL HIGH (ref 0.61–1.24)
GFR, Estimated: 44 mL/min — ABNORMAL LOW
Glucose, Bld: 116 mg/dL — ABNORMAL HIGH (ref 70–99)
Potassium: 3.8 mmol/L (ref 3.5–5.1)
Sodium: 135 mmol/L (ref 135–145)
Total Bilirubin: 1.3 mg/dL — ABNORMAL HIGH (ref 0.0–1.2)
Total Protein: 7.9 g/dL (ref 6.5–8.1)

## 2024-02-26 LAB — LIPASE, BLOOD: Lipase: 26 U/L (ref 11–51)

## 2024-02-26 LAB — CK: Total CK: >20000 U/L — ABNORMAL HIGH (ref 49–397)

## 2024-02-26 LAB — TROPONIN T, HIGH SENSITIVITY: Troponin T High Sensitivity: 6 ng/L (ref 0–19)

## 2024-02-26 LAB — PROTIME-INR
INR: 0.9 (ref 0.8–1.2)
Prothrombin Time: 12.7 s (ref 11.4–15.2)

## 2024-02-26 LAB — ACETAMINOPHEN LEVEL: Acetaminophen (Tylenol), Serum: <10 ug/mL — ABNORMAL LOW (ref 10–30)

## 2024-02-26 LAB — MAGNESIUM: Magnesium: 2.8 mg/dL — ABNORMAL HIGH (ref 1.7–2.4)

## 2024-02-26 MED ORDER — THIAMINE MONONITRATE 100 MG PO TABS
100.0000 mg | ORAL_TABLET | Freq: Every day | ORAL | Status: DC
  Administered 2024-02-26 – 2024-02-29 (×4): 100 mg via ORAL
  Filled 2024-02-26 (×4): qty 1

## 2024-02-26 MED ORDER — LORAZEPAM 1 MG PO TABS
1.0000 mg | ORAL_TABLET | Freq: Once | ORAL | Status: AC
  Administered 2024-02-26: 1 mg via ORAL
  Filled 2024-02-26: qty 1

## 2024-02-26 MED ORDER — ALBUTEROL SULFATE (2.5 MG/3ML) 0.083% IN NEBU: 2.5000 mg | INHALATION_SOLUTION | RESPIRATORY_TRACT | Status: DC | PRN

## 2024-02-26 MED ORDER — LORAZEPAM 2 MG/ML IJ SOLN
1.0000 mg | Freq: Four times a day (QID) | INTRAMUSCULAR | Status: DC | PRN
  Administered 2024-02-27: 1 mg via INTRAVENOUS
  Filled 2024-02-26: qty 1

## 2024-02-26 MED ORDER — IOHEXOL 350 MG/ML SOLN
75.0000 mL | Freq: Once | INTRAVENOUS | Status: AC | PRN
  Administered 2024-02-26: 75 mL via INTRAVENOUS

## 2024-02-26 MED ORDER — SODIUM CHLORIDE 0.9 % IV SOLN: Freq: Once | INTRAVENOUS | Status: AC

## 2024-02-26 MED ORDER — FENTANYL CITRATE (PF) 50 MCG/ML IJ SOSY
50.0000 ug | PREFILLED_SYRINGE | Freq: Once | INTRAMUSCULAR | Status: AC
  Administered 2024-02-26: 50 ug via INTRAVENOUS
  Filled 2024-02-26: qty 1

## 2024-02-26 MED ORDER — NICOTINE 14 MG/24HR TD PT24
14.0000 mg | MEDICATED_PATCH | Freq: Every day | TRANSDERMAL | Status: DC
  Filled 2024-02-26: qty 1

## 2024-02-26 MED ORDER — ACETAMINOPHEN 500 MG PO TABS: 500.0000 mg | ORAL_TABLET | Freq: Four times a day (QID) | ORAL | Status: DC | PRN

## 2024-02-26 MED ORDER — OXYCODONE-ACETAMINOPHEN 5-325 MG PO TABS
1.0000 | ORAL_TABLET | Freq: Four times a day (QID) | ORAL | Status: DC | PRN
  Administered 2024-02-26: 1 via ORAL
  Filled 2024-02-26: qty 1

## 2024-02-26 MED ORDER — CYCLOBENZAPRINE HCL 10 MG PO TABS
10.0000 mg | ORAL_TABLET | Freq: Two times a day (BID) | ORAL | Status: DC | PRN
  Administered 2024-02-27 – 2024-02-29 (×3): 10 mg via ORAL
  Filled 2024-02-26 (×3): qty 1

## 2024-02-26 MED ORDER — SODIUM CHLORIDE 0.9 % IV BOLUS
1000.0000 mL | Freq: Once | INTRAVENOUS | Status: AC
  Administered 2024-02-26: 1000 mL via INTRAVENOUS

## 2024-02-26 MED ORDER — SODIUM CHLORIDE 0.9 % IV SOLN: INTRAVENOUS | Status: DC

## 2024-02-26 MED ORDER — HEPARIN SODIUM (PORCINE) 5000 UNIT/ML IJ SOLN
5000.0000 [IU] | Freq: Three times a day (TID) | INTRAMUSCULAR | Status: DC
  Administered 2024-02-26 – 2024-02-29 (×10): 5000 [IU] via SUBCUTANEOUS
  Filled 2024-02-26 (×11): qty 1

## 2024-02-26 MED ORDER — OXYCODONE HCL 5 MG PO TABS
2.5000 mg | ORAL_TABLET | Freq: Once | ORAL | Status: AC
  Administered 2024-02-26: 2.5 mg via ORAL
  Filled 2024-02-26: qty 1

## 2024-02-26 NOTE — ED Notes (Signed)
 Aspen collar unavailable at this location at this time. EDP notified. Verbal for [place] J collar received. [place] J collar applied at this time, pt tolerating appropriately.

## 2024-02-26 NOTE — ED Notes (Signed)
 CCMD called for cardiac monitoring.

## 2024-02-26 NOTE — ED Notes (Signed)
 Pt aware urine sample is necessary, pt unable to provide.

## 2024-02-26 NOTE — ED Notes (Signed)
 Gave report to [employer] for pt coming over

## 2024-02-26 NOTE — ED Provider Notes (Signed)
 " [facility] AT [facility] Provider Note   CSN: [id number] Arrival date & time: 02/26/24  1247     Patient presents with: Gavin Bowen Mardy is a 49 y.o. male.  {Add pertinent medical, surgical, social history, OB history to HPI:32947}  Fall     49 year old male with medical history significant for substance abuse emergency department after a syncopal episode.  The patient states that yesterday afternoon he was in his normal state of health when he thinks he may have gone from a seated to a standing position and subsequently blacked out.  He is not sure if he had a seizure.  He states he has 1 possible history of witnessed tonic-clonic jerking activity, is not on any medications for seizure.  He denies any tongue biting or urinary incontinence.  He came to and made a phone call to his mother at around 3 PM.  Since his fall he has had neck pain as well as weakness in his right arm as well as a heaviness sensation in his left foot with numbness in his left foot.  He endorses pain shooting down his leg.  Prior to Admission medications  Medication Sig Start Date End Date Taking? Authorizing Provider  acetaminophen  (TYLENOL ) 500 MG tablet Take 1 tablet (500 mg total) by mouth every 6 (six) hours as needed. Patient not taking: Reported on 08/02/2022 10/13/20   Nanavati, Ankit, MD  bacitracin  ointment Apply 1 application topically 2 (two) times daily. Patient not taking: Reported on 08/02/2022 10/13/20   Charlyn Sora, MD  cyclobenzaprine  (FLEXERIL ) 10 MG tablet Take 1 tablet (10 mg total) by mouth 2 (two) times daily as needed (Body aches and pain). 08/05/22   Arloa Suzen RAMAN, NP  dicyclomine  (BENTYL ) 20 MG tablet Take 1 tablet (20 mg total) by mouth 2 (two) times daily. 08/02/22   Beverley Leita LABOR, PA-C  HYDROcodone -acetaminophen  (NORCO/VICODIN) 5-325 MG tablet Take 1 tablet by mouth every 6 (six) hours as needed for severe pain. Patient not taking: Reported on  08/02/2022 04/28/22   Groce, Christopher F, PA-C  hydrOXYzine  (ATARAX ) 50 MG tablet Take 1 tablet (50 mg total) by mouth at bedtime as needed. 08/05/22   Arloa Suzen RAMAN, NP  ibuprofen  (ADVIL ) 600 MG tablet Take 1 tablet (600 mg total) by mouth every 6 (six) hours as needed. 01/28/23   Odell Balls, PA-C  methocarbamol  (ROBAXIN ) 500 MG tablet Take 1 tablet (500 mg total) by mouth 2 (two) times daily. 08/02/22   Beverley Leita LABOR, PA-C  ondansetron  (ZOFRAN -ODT) 4 MG disintegrating tablet Take 1 tablet (4 mg total) by mouth every 8 (eight) hours as needed for nausea or vomiting. 08/02/22   Beverley Leita LABOR, PA-C  oxyCODONE -acetaminophen  (PERCOCET/ROXICET) 5-325 MG tablet Take 1 tablet by mouth every 6 (six) hours as needed for severe pain. Patient not taking: Reported on 08/02/2022 10/13/20   Nanavati, Ankit, MD    Allergies: Patient has no known allergies.    Review of Systems  Updated Vital Signs BP (!) 161/106 (BP Location: Left Arm)   Pulse 78   Temp 98.1 F (36.7 C) (Oral)   Resp 17   Ht 6' 2 (1.88 m)   Wt 99.8 kg   SpO2 99%   BMI 28.25 kg/m   Physical Exam  (all labs ordered are listed, but only abnormal results are displayed) Labs Reviewed - No data to display  EKG: None  Radiology: No results found.  {Document cardiac monitor, telemetry assessment  procedure when appropriate:32947} Procedures   Medications Ordered in the ED - No data to display    {Click here for ABCD2, HEART and other calculators REFRESH Note before signing:1}                              Medical Decision Making Amount and/or Complexity of Data Reviewed Labs: ordered. Radiology: ordered.  Risk Prescription drug management.   ***  {Document critical care time when appropriate  Document review of labs and clinical decision tools ie CHADS2VASC2, etc  Document your independent review of radiology images and any outside records  Document your discussion with family members, caretakers and with  consultants  Document social determinants of health affecting pt's care  Document your decision making why or why not admission, treatments were needed:32947:::1}   Final diagnoses:  None    ED Discharge Orders     None        "

## 2024-02-26 NOTE — Plan of Care (Signed)
 This is a 49 year old gentleman with history of drug abuse who presented to the [facility] ED today secondary to fall.  According to ED physician, patient initially had an episode of syncope/fall on Saturday/2 days ago.  It is unknown as how long he was on the floor.  Patient apparently has informed the ED physician that he was doing the drugs including ecstasy.  His UDS is positive today for fentanyl  and cocaine.  Reportedly, when he woke up from the syncope, he started feeling right upper extremity weakness and left lower extremity weakness.  He did not think much of the symptoms but since his symptoms continued, he was brought in by his mother to the ED today to seek further medical care. In the ED, patient has had significant workup including CTA head and neck, CT cervical spine, right shoulder x-ray and chest x-ray, all of them are unremarkable.  Blood pressure slightly elevated, meeting criteria for hypertensive urgency but otherwise hemodynamically stable.  Does not carry any history of hypertension, does not appear to be on any medications.  Patient had denied tongue biting or urinary incontinence.  Reportedly, patient has an episode of tonic-clonic seizures in the past but he does not appear to be on any antiseizure medications either.  CBC shows mild leukocytosis, BMP shows creatinine of 1.91 meeting criteria for AKI.  Troponin and proBNP unremarkable.  Patient was given c-collar but patient refused to wear that.  According to ED physician, his suspicion is more likely rhabdomyolysis, CK is pending at this point in time.  Stroke is also on the list for differential diagnosis but suspicion is very low.  Patient will need MRI brain once arrives to [facility] to rule out a stroke.  Patient has received lorazepam , fentanyl  in the ED.  Patient has been accepted to telemetry for further workup of syncope.  ED physician to remain responsible for patient care until patient arrives at [facility] and gets full  admission by [facility] service.

## 2024-02-26 NOTE — H&P (Signed)
 " History and Physical    Patient: Gavin Bowen FMW:[id number] DOB: [birth year] DOA: 02/26/2024 DOS: the patient was seen and examined on 02/26/2024 PCP: Patient, No Pcp Per  Patient coming from: Home  Chief Complaint:  Chief Complaint  Patient presents with   Fall   HPI: KYE HEDDEN is a right-hand-dominant 49 y.o. male with significant medical history of polysubstance abuse .  He presents to the emergency room on account of syncope at home.  He recalls partying 2 nights ago with some friends.  He admits to having used illicit drugs that he thought was ecstasy.  He woke up next morning on the floor with what he perceived as involuntary movements of his right upper extremity.  He is unable to tell how long he was on the floor. He attributed most likely to possible seizures.Symptoms were unwitnessed. He has since had weakness of his right upper extremity with tingling sensation.  He also reports having numbness in his left lower extremity.  Due to persistence of his symptoms, patient decided to come to the emergency room.  He admits however to 1 prior history of tonic-clonic seizures.  Not on any chronic medications.  He admits to pain in his right shoulder but denies any chest pain, palpitations nausea or vomiting.  Denies any prior history of TIAs or CVA.  Denies coronary disease.  In the ED, labs were significant for markedly elevated CPK levels suggesting rhabdomyolysis, elevated serum creatinine concerning for AKI, elevated LFTs. CT scan of the brain showed normal head CT, normal cervical spine CT, small right frontal scalp hematoma.  CTA head shows normal findings  Review of Systems: As mentioned in the history of present illness. All other systems reviewed and are negative. Past Medical History:  Diagnosis Date   Heart murmur    Past Surgical History:  Procedure Laterality Date   right index finger surgery     Social History:  reports that he has been smoking cigarettes. He has  never used smokeless tobacco. He reports that he does not currently use drugs after having used the following drugs: Cocaine. He reports that he does not drink alcohol.  Allergies[1]  History reviewed. No pertinent family history.  Prior to Admission medications  Medication Sig Start Date End Date Taking? Authorizing Provider  acetaminophen  (TYLENOL ) 500 MG tablet Take 1 tablet (500 mg total) by mouth every 6 (six) hours as needed. Patient not taking: Reported on 08/02/2022 10/13/20   Charlyn Sora, MD  bacitracin  ointment Apply 1 application topically 2 (two) times daily. Patient not taking: Reported on 08/02/2022 10/13/20   Charlyn Sora, MD  cyclobenzaprine  (FLEXERIL ) 10 MG tablet Take 1 tablet (10 mg total) by mouth 2 (two) times daily as needed (Body aches and pain). 08/05/22   Arloa Suzen RAMAN, NP  dicyclomine  (BENTYL ) 20 MG tablet Take 1 tablet (20 mg total) by mouth 2 (two) times daily. 08/02/22   Beverley Leita LABOR, PA-C  HYDROcodone -acetaminophen  (NORCO/VICODIN) 5-325 MG tablet Take 1 tablet by mouth every 6 (six) hours as needed for severe pain. Patient not taking: Reported on 08/02/2022 04/28/22   Groce, Christopher F, PA-C  hydrOXYzine  (ATARAX ) 50 MG tablet Take 1 tablet (50 mg total) by mouth at bedtime as needed. 08/05/22   Arloa Suzen RAMAN, NP  ibuprofen  (ADVIL ) 600 MG tablet Take 1 tablet (600 mg total) by mouth every 6 (six) hours as needed. 01/28/23   Odell Balls, PA-C  methocarbamol  (ROBAXIN ) 500 MG tablet Take 1 tablet (500 mg  total) by mouth 2 (two) times daily. 08/02/22   Beverley Leita LABOR, PA-C  ondansetron  (ZOFRAN -ODT) 4 MG disintegrating tablet Take 1 tablet (4 mg total) by mouth every 8 (eight) hours as needed for nausea or vomiting. 08/02/22   Beverley Leita LABOR, PA-C  oxyCODONE -acetaminophen  (PERCOCET/ROXICET) 5-325 MG tablet Take 1 tablet by mouth every 6 (six) hours as needed for severe pain. Patient not taking: Reported on 08/02/2022 10/13/20   Charlyn Sora, MD    Physical  Exam: Vitals:   02/26/24 1812 02/26/24 1921 02/26/24 1922 02/26/24 2036  BP:  (!) 184/107  (!) 163/91  Pulse:  81 69 70  Resp:  17 18 18   Temp: 98.1 F (36.7 C)   98.2 F (36.8 C)  TempSrc:    Oral  SpO2:  (!) 89% 100% 100%  Weight:      Height:       General: Patient is a well-built gentleman, not in any acute distress HEENT: Head is atraumatic.  Conjunctiva clear. Neck: Supple no JVD Chest: Good breath sounds bilaterally Cardiovascular: Regular S1-S2 no murmur Abdomen: Soft nontender CNS: Significant for focal weakness in right upper extremity.  Power of 3/5.  Left lower extremity numbness+.Power 3-4/5. Skin negative for any new rash.    Data Reviewed: Sodium is 135, potassium 3.8, chloride 96, bicarb 29, glucose 116, BUN 21, creatinine 1.90, calcium 9.5 magnesium 2.8 AST 766, ALT 264, total bilirubin 1.3, CPK >20,000 WBC 11.2, hemoglobin 15.5, hematocrit 41, platelet count 251, neutrophils 74, PT 12.7, INR 0.9, Urine drug screen positive for cocaine and fentanyl   CT scan of the abdomen and pelvis revealed a right latissimus dorsal enlargement with stranding suggesting intramuscular hematoma. CT head normal Cervical spine CT normal CTA of the head: Normal Xray of Right Shoulder showed deformity of the RIGHT proximal humerus, consistent with prior healed fracture.   Assessment and Plan: 49 year old with prior history of substance abuse who presents to the emergency room with right upper extremity weakness, left lower extremity numbness.  Patient reports blacking out 2 days prior.  Symptoms occurred after patient had use illicit drugs.  Drug screen was positive for fentanyl  and cocaine.  New onset right upper extremity weakness and left lower extremity numbness, post fall.  X-ray of the right shoulder shows old fracture with some deformity.No acute findings. Differentials for weakness include acute stroke versus Todd's paresis.  Will obtain MRI of the brain.  CT head and neck  were unremarkable.  CT cervical spine was also reported unremarkable.  Consult neurology pending MRI findings.  Acute Rhabdomyolysis: Multifactorial in etiology secondary to trauma from fall, cocaine use.  Patient is at increased risk of myoglobin induced kidney injury.  Patient will be profusely volume repleted.  Consider nephrology consult if CPK does not improve with IV fluids.  Acute kidney injury: Most likely tubular injury in etiology from myoglobin.  Dehydration may also be contributory.  Patient will be volume repleted with IV fluids.  Will monitor kidney function daily.  Avoiding all nephrotoxic medication.Consider nephrology consult if renal function does not improve with IV fluids.  Elevated Transaminases: Suspect hepatic injury from drugs.  Will rule out acute hepatitis.  Polysubstance abuse: Patient is on cocaine and fentanyl .  Substance abuse counseling was advised.  No withdrawal symptoms at this time.  Will optimize pain management with oxycodone  as needed.  IV morphine  as needed for severe pain.  As needed Ativan  for anxiety.  Will add thiamine  to medications.   Advance Care Planning:  Code Status: Full Code   Consults:   Family Communication: No family at bedside  Severity of Illness: The appropriate patient status for this patient is INPATIENT. Inpatient status is judged to be reasonable and necessary in order to provide the required intensity of service to ensure the patient's safety. The patient's presenting symptoms, physical exam findings, and initial radiographic and laboratory data in the context of their chronic comorbidities is felt to place them at high risk for further clinical deterioration. Furthermore, it is not anticipated that the patient will be medically stable for discharge from the hospital within 2 midnights of admission.   * I certify that at the point of admission it is my clinical judgment that the patient will require inpatient hospital care spanning  beyond 2 midnights from the point of admission due to high intensity of service, high risk for further deterioration and high frequency of surveillance required.*  Author: Maude MARLA Dart, MD 02/26/2024 9:01 PM  For on call review [email or web address].      [1] No Known Allergies  "

## 2024-02-27 ENCOUNTER — Encounter (HOSPITAL_COMMUNITY): Payer: Self-pay | Admitting: Radiology

## 2024-02-27 ENCOUNTER — Inpatient Hospital Stay (HOSPITAL_COMMUNITY)

## 2024-02-27 DIAGNOSIS — N179 Acute kidney failure, unspecified: Secondary | ICD-10-CM

## 2024-02-27 DIAGNOSIS — T148XXA Other injury of unspecified body region, initial encounter: Secondary | ICD-10-CM | POA: Insufficient documentation

## 2024-02-27 DIAGNOSIS — R29898 Other symptoms and signs involving the musculoskeletal system: Secondary | ICD-10-CM

## 2024-02-27 DIAGNOSIS — E876 Hypokalemia: Secondary | ICD-10-CM | POA: Insufficient documentation

## 2024-02-27 DIAGNOSIS — R7989 Other specified abnormal findings of blood chemistry: Secondary | ICD-10-CM | POA: Insufficient documentation

## 2024-02-27 LAB — COMPREHENSIVE METABOLIC PANEL WITH GFR
ALT: 205 U/L — ABNORMAL HIGH (ref 0–44)
AST: 493 U/L — ABNORMAL HIGH (ref 15–41)
Albumin: 3.6 g/dL (ref 3.5–5.0)
Alkaline Phosphatase: 67 U/L (ref 38–126)
Anion gap: 10 (ref 5–15)
BUN: 15 mg/dL (ref 6–20)
CO2: 26 mmol/L (ref 22–32)
Calcium: 8.6 mg/dL — ABNORMAL LOW (ref 8.9–10.3)
Chloride: 101 mmol/L (ref 98–111)
Creatinine, Ser: 1.65 mg/dL — ABNORMAL HIGH (ref 0.61–1.24)
GFR, Estimated: 52 mL/min — ABNORMAL LOW
Glucose, Bld: 130 mg/dL — ABNORMAL HIGH (ref 70–99)
Potassium: 3.4 mmol/L — ABNORMAL LOW (ref 3.5–5.1)
Sodium: 137 mmol/L (ref 135–145)
Total Bilirubin: 1.1 mg/dL (ref 0.0–1.2)
Total Protein: 6.8 g/dL (ref 6.5–8.1)

## 2024-02-27 LAB — FOLATE: Folate: 10.5 ng/mL

## 2024-02-27 LAB — CBC
HCT: 39.6 % (ref 39.0–52.0)
Hemoglobin: 14.8 g/dL (ref 13.0–17.0)
MCH: 31.2 pg (ref 26.0–34.0)
MCHC: 37.4 g/dL — ABNORMAL HIGH (ref 30.0–36.0)
MCV: 83.5 fL (ref 80.0–100.0)
Platelets: 254 10*3/uL (ref 150–400)
RBC: 4.74 MIL/uL (ref 4.22–5.81)
RDW: 12.9 % (ref 11.5–15.5)
WBC: 9.2 10*3/uL (ref 4.0–10.5)
nRBC: 0 % (ref 0.0–0.2)

## 2024-02-27 LAB — HIV ANTIBODY (ROUTINE TESTING W REFLEX): HIV Screen 4th Generation wRfx: NONREACTIVE

## 2024-02-27 LAB — CK: Total CK: >20000 U/L — ABNORMAL HIGH (ref 49–397)

## 2024-02-27 LAB — VITAMIN B12: Vitamin B-12: 444 pg/mL (ref 180–914)

## 2024-02-27 MED ORDER — OXYCODONE HCL 5 MG PO TABS
5.0000 mg | ORAL_TABLET | ORAL | Status: DC | PRN
  Administered 2024-02-27 – 2024-02-29 (×8): 5 mg via ORAL
  Filled 2024-02-27 (×8): qty 1

## 2024-02-27 MED ORDER — SODIUM CHLORIDE 0.9 % IV SOLN: INTRAVENOUS | Status: DC

## 2024-02-27 MED ORDER — ACETAMINOPHEN 500 MG PO TABS
500.0000 mg | ORAL_TABLET | Freq: Four times a day (QID) | ORAL | Status: DC | PRN
  Administered 2024-02-27: 500 mg via ORAL
  Filled 2024-02-27: qty 1

## 2024-02-27 NOTE — Plan of Care (Signed)

## 2024-02-27 NOTE — Progress Notes (Signed)
 Orthopedic Tech Progress Note Patient Details:  Gavin Bowen 1975/05/05 [id number]  Ortho Devices Type of Ortho Device: Velcro wrist splint Ortho Device/Splint Location: RUE Ortho Device/Splint Interventions: Ordered, Application, Adjustment   Post Interventions Patient Tolerated: Well Instructions Provided: Care of device  Gavin Bowen Pac 02/27/2024, 9:33 AM

## 2024-02-27 NOTE — Assessment & Plan Note (Signed)
 Replete as needed

## 2024-02-27 NOTE — Assessment & Plan Note (Signed)
-   Right latissimus dorsi hematoma likely from being down on his right side on the ground - Continue supportive care and heat pack as needed -Trend CBC

## 2024-02-27 NOTE — Assessment & Plan Note (Signed)
-   Likely precipitated from drug use.  No prior history of syncope -Initial troponin negative; no significant signs of ischemia on EKG -Hold off on further workup given likely precipitated by drug use

## 2024-02-27 NOTE — Hospital Course (Signed)
 Gavin Bowen is a 49 yo male with no pertinent PMH who presented after being found on the floor at home.  He had endorsed using ecstasy with his partner and apparently had passed out on the floor after she had left.  Unclear how long he was down but likely approximately 2 days based off the timeframe.  UDS was positive for cocaine and fentanyl  on admission. Further workup showed significantly elevated CK, greater than 20,000. Creatinine 1.9 on admission, BUN 21.  He was started on high rate fluids and admitted for further workup.

## 2024-02-27 NOTE — Assessment & Plan Note (Signed)
-   baseline creatinine ~ 0.8 - 1 - patient presents with increase in creat >0.3 mg/dL above baseline or creat increase >1.5x baseline presumed to have occurred within past 7 days PTA - creat 1.9 on admission - Suspecting prerenal for now but at risk for pigment nephropathy in setting of severe rhabdo - Continue fluids and trending BMP

## 2024-02-28 LAB — CBC WITH DIFFERENTIAL/PLATELET
Abs Immature Granulocytes: 0.05 10*3/uL (ref 0.00–0.07)
Basophils Absolute: 0 10*3/uL (ref 0.0–0.1)
Basophils Relative: 1 %
Eosinophils Absolute: 0.1 10*3/uL (ref 0.0–0.5)
Eosinophils Relative: 1 %
HCT: 38 % — ABNORMAL LOW (ref 39.0–52.0)
Hemoglobin: 14.1 g/dL (ref 13.0–17.0)
Immature Granulocytes: 1 %
Lymphocytes Relative: 30 %
Lymphs Abs: 2.5 10*3/uL (ref 0.7–4.0)
MCH: 31 pg (ref 26.0–34.0)
MCHC: 37.1 g/dL — ABNORMAL HIGH (ref 30.0–36.0)
MCV: 83.5 fL (ref 80.0–100.0)
Monocytes Absolute: 0.7 10*3/uL (ref 0.1–1.0)
Monocytes Relative: 9 %
Neutro Abs: 4.9 10*3/uL (ref 1.7–7.7)
Neutrophils Relative %: 58 %
Platelets: 272 10*3/uL (ref 150–400)
RBC: 4.55 MIL/uL (ref 4.22–5.81)
RDW: 12.8 % (ref 11.5–15.5)
WBC: 8.4 10*3/uL (ref 4.0–10.5)
nRBC: 0 % (ref 0.0–0.2)

## 2024-02-28 LAB — MYOGLOBIN, SERUM: Myoglobin: 796 ng/mL — ABNORMAL HIGH (ref 28–72)

## 2024-02-28 LAB — COMPREHENSIVE METABOLIC PANEL WITH GFR
ALT: 153 U/L — ABNORMAL HIGH (ref 0–44)
AST: 327 U/L — ABNORMAL HIGH (ref 15–41)
Albumin: 3.4 g/dL — ABNORMAL LOW (ref 3.5–5.0)
Alkaline Phosphatase: 58 U/L (ref 38–126)
Anion gap: 9 (ref 5–15)
BUN: 11 mg/dL (ref 6–20)
CO2: 25 mmol/L (ref 22–32)
Calcium: 8.6 mg/dL — ABNORMAL LOW (ref 8.9–10.3)
Chloride: 105 mmol/L (ref 98–111)
Creatinine, Ser: 1.51 mg/dL — ABNORMAL HIGH (ref 0.61–1.24)
GFR, Estimated: 57 mL/min — ABNORMAL LOW
Glucose, Bld: 113 mg/dL — ABNORMAL HIGH (ref 70–99)
Potassium: 3.4 mmol/L — ABNORMAL LOW (ref 3.5–5.1)
Sodium: 140 mmol/L (ref 135–145)
Total Bilirubin: 1.2 mg/dL (ref 0.0–1.2)
Total Protein: 6.3 g/dL — ABNORMAL LOW (ref 6.5–8.1)

## 2024-02-28 LAB — CK: Total CK: 12603 U/L — ABNORMAL HIGH (ref 49–397)

## 2024-02-28 LAB — MAGNESIUM: Magnesium: 2.1 mg/dL (ref 1.7–2.4)

## 2024-02-28 MED ORDER — POTASSIUM CHLORIDE CRYS ER 20 MEQ PO TBCR
60.0000 meq | EXTENDED_RELEASE_TABLET | Freq: Once | ORAL | Status: AC
  Administered 2024-02-28: 60 meq via ORAL
  Filled 2024-02-28: qty 3

## 2024-02-28 NOTE — Progress Notes (Signed)
 " Progress Note   Patient: Gavin Bowen FMW:[id number] DOB: 18-Aug-1975 DOA: 02/26/2024     2 DOS: the patient was seen and examined on 02/28/2024   Brief hospital course: [name] is a 49 yo male with no pertinent PMH who presented after being found on the floor at home.  He had endorsed using ecstasy with his partner and apparently had passed out on the floor after she had left.  Unclear how long he was down but likely approximately 2 days based off the timeframe.  UDS was positive for cocaine and fentanyl  on admission. Further workup showed significantly elevated CK, greater than 20,000. Creatinine 1.9 on admission, BUN 21.  He was started on high rate fluids and admitted for further workup.  Assessment and Plan: Rhabdomyolysis - passed out after ectasy at home, but UDS noted with cocaine and fentanyl ; remorseful and plans to not do again - Initial CK elevated greater than 20,000, now down to 12,603 -Continue fluids and trending CK daily   Syncope-resolved as of 02/27/2024 - Likely precipitated from drug use.  No prior history of syncope -Initial troponin negative; no significant signs of ischemia on EKG -Hold off on further workup given likely precipitated by drug use   AKI (acute kidney injury) - baseline creatinine ~ 0.8 - 1 - patient presents with increase in creat >0.3 mg/dL above baseline or creat increase >1.5x baseline presumed to have occurred within past 7 days PTA - creat 1.9 on admission - Suspecting prerenal for now but at risk for pigment nephropathy in setting of severe rhabdo - improving with hydration   Right arm weakness - Presumed due to rhabdomyolysis and potentially laying on right side while on the floor -No signs of compartment syndrome -MRI brain negative for stroke nor signs of PRES - place right wrist splint; PT ordered - monitor for any signs of compartment syndrome or worsening muscle weakness -Recommend keeping arm elevated   Hematoma - Right  latissimus dorsi hematoma likely from being down on his right side on the ground - Continue supportive care and heat pack as needed -Trend CBC   Elevated LFTs - Likely from acute illness/congestion and/or rhabdomyolysis - Hepatitis panel negative.  INR normal - trend LFTs   Hypokalemia - Replete as needed   Subjective: Complaining of continued soreness and swelling of R arm  Physical Exam: Vitals:   02/28/24 0352 02/28/24 0808 02/28/24 1226 02/28/24 1555  BP: (!) 153/98 127/69 (!) 154/100 (!) 140/100  Pulse: 70 69 75 71  Resp: 19 18 18 18   Temp: 98.3 F (36.8 C) 98.1 F (36.7 C) 98.2 F (36.8 C) 97.9 F (36.6 C)  TempSrc:      SpO2: 100% 98% 98% 99%  Weight:      Height:       General exam: Awake, laying in bed, in nad Respiratory system: Normal respiratory effort, no wheezing Cardiovascular system: regular rate, s1, s2 Gastrointestinal system: Soft, nondistended, positive BS Central nervous system: CN2-12 grossly intact, strength intact Extremities: Perfused, no clubbing, R arm swollen, tender. Distal pulses are palpable on RUE Skin: Normal skin turgor, no notable skin lesions seen Psychiatry: Mood normal // no visual hallucinations   Data Reviewed:  Labs reviewed: Na 140, K 3.4, Cr 1.51, Alk phos 58, AST 327, ALT 153, [place] [address], WBC 8.4, Hgb 14.1, Plts 272  Family Communication: Pt in room, family not at bedside  Disposition: Status is: Inpatient Remains inpatient appropriate because: severity of illness  Planned Discharge Destination:  Home    Author: Garnette Pelt, MD 02/28/2024 4:49 PM  For on call review [email or web address].  "

## 2024-02-28 NOTE — Evaluation (Signed)
 Occupational Therapy Evaluation Patient Details Name: Gavin Bowen MRN: [id number] DOB: July 12, 1975 Today's Date: 02/28/2024   History of Present Illness   Pt is 49 yo male presenting to [facility] on 2/2 due to being found on the floor at home. Pt found to have Rhabdomyolysis, AKI. PMH: no significant medical history     Clinical Impressions Pt was independent prior to admission. Reports pain on R side of neck and numbness/tingling from R forearm distal. Pt with weakness throughout R UE with swelling in upper arm and no functional use. Pt with wrist cock up splint donned upon entry, educated in risks and benefits of splint. Began educating pt in compensatory strategies for ADLs, positioning R UE to reduce edema and R UE A/AAROM. Pt supervised OOB. He needs set up to min assist for ADLs. Will follow acutely. Recommending OPOT upon discharge.      If plan is discharge home, recommend the following:   A little help with bathing/dressing/bathroom;Assistance with cooking/housework     Functional Status Assessment   Patient has had a recent decline in their functional status and demonstrates the ability to make significant improvements in function in a reasonable and predictable amount of time.     Equipment Recommendations   None recommended by OT     Recommendations for Other Services         Precautions/Restrictions   Precautions Precautions: Fall;Other (comment) (seizure) Recall of Precautions/Restrictions: Intact Required Braces or Orthoses: Splint/Cast Splint/Cast: R wrist cock up Splint/Cast - Date Prophylactic Dressing Applied (if applicable): 02/27/24 Restrictions Weight Bearing Restrictions Per Provider Order: No     Mobility Bed Mobility Overal bed mobility: Modified Independent                  Transfers Overall transfer level: Modified independent Equipment used: None                      Balance                                            ADL either performed or assessed with clinical judgement   ADL Overall ADL's : Needs assistance/impaired Eating/Feeding: Minimal assistance;Sitting Eating/Feeding Details (indicate cue type and reason): with L hand leading Grooming: Sitting;Minimal assistance Grooming Details (indicate cue type and reason): with L hand lead Upper Body Bathing: Minimal assistance;Sitting   Lower Body Bathing: Set up;Sit to/from stand   Upper Body Dressing : Minimal assistance;Sitting Upper Body Dressing Details (indicate cue type and reason): instructed to dress R UE first, undress last Lower Body Dressing: Sitting/lateral leans;Minimal assistance   Toilet Transfer: Supervision/safety;Ambulation   Toileting- Clothing Manipulation and Hygiene: Supervision/safety       Functional mobility during ADLs: Supervision/safety General ADL Comments: Educated in edema management of R UE. Pt appears to use his R UE more when not in wrist cock up splint. Instructed in AROM/self ROM of R UE and to check for skin pressure areas when coming out of splint.     Vision Ability to See in Adequate Light: 0 Adequate Patient Visual Report: No change from baseline       Perception         Praxis         Pertinent Vitals/Pain Pain Assessment Pain Assessment: Faces Faces Pain Scale: Hurts even more Pain Location: neck Pain Descriptors / Indicators: Sore Pain Intervention(s):  Monitored during session, Repositioned, Patient requesting pain meds-RN notified     Extremity/Trunk Assessment Upper Extremity Assessment Upper Extremity Assessment: Right hand dominant;RUE deficits/detail RUE Deficits / Details: edematous upper arm, numbness and tinglin from mid forearm distal, AROM shoulder FF to 30 degrees, PROM to 100, 4-/5 elbow strength, 3+/5 wrist, full finger flexion, unable to oppose thumb or full extend MCPs RUE Coordination: decreased fine motor;decreased gross motor   Lower Extremity  Assessment Lower Extremity Assessment: Defer to PT evaluation   Cervical / Trunk Assessment Cervical / Trunk Assessment: Normal   Communication Communication Communication: No apparent difficulties   Cognition Arousal: Alert Behavior During Therapy: WFL for tasks assessed/performed               OT - Cognition Comments: pt stating his meal tray was brought in this morning when it was his dinner tray from last night                 Following commands: Intact       Cueing  General Comments   Cueing Techniques: Verbal cues      Exercises     Shoulder Instructions      Home Living Family/patient expects to be discharged to:: Private residence Living Arrangements: Parent (mom) Available Help at Discharge: Family;Available PRN/intermittently Type of Home: House Home Access: Stairs to enter [employer] of Steps: 4 Entrance Stairs-Rails: Left Home Layout: One level     Bathroom Shower/Tub: Chief Strategy Officer: Standard     Home Equipment: None          Prior Functioning/Environment Prior Level of Function : Independent/Modified Independent;Working/employed;Driving             Mobility Comments: 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 ADLs Comments: Ind    OT Problem List: Decreased strength;Decreased range of motion;Decreased coordination;Pain;Impaired UE functional use   OT Treatment/Interventions: Self-care/ADL training;Therapeutic activities;Patient/family education;Therapeutic exercise;DME and/or AE instruction      OT Goals(Current goals can be found in the care plan section)   Acute Rehab OT Goals OT Goal Formulation: With patient Time For Goal Achievement: 03/13/24 Potential to Achieve Goals: Good ADL Goals Pt/caregiver will Perform Home Exercise Program: Increased ROM;Increased strength;Right Upper extremity;Independently;With written HEP provided Additional ADL Goal #1: Pt will be  complete basic ADLs mod I. Additional ADL Goal #2: Pt will use R UE as a functional assist during ADLs. Additional ADL Goal #3: Pt will be knowledgeable in edema management techniques for R UE.   OT Frequency:  Min 3X/week    Co-evaluation              AM-PAC OT 6 Clicks Daily Activity     Outcome Measure Help from another person eating meals?: A Little Help from another person taking care of personal grooming?: A Little Help from another person toileting, which includes using toliet, bedpan, or urinal?: A Little Help from another person bathing (including washing, rinsing, drying)?: A Little Help from another person to put on and taking off regular upper body clothing?: A Little Help from another person to put on and taking off regular lower body clothing?: A Little 6 Click Score: 18   End of Session    Activity Tolerance: Patient tolerated treatment well Patient left: in bed;with call bell/phone within reach;with family/visitor present  OT Visit Diagnosis: Muscle weakness (generalized) (M62.81);Pain                Time: [phone]  OT Time Calculation (min): 40 min Charges:  OT General Charges $OT Visit: 1 Visit OT Evaluation $OT Eval Moderate Complexity: 1 Mod OT Treatments $Self Care/Home Management : 8-22 mins $Therapeutic Exercise: 8-22 mins Mliss HERO, OTR/L Acute Rehabilitation Services Office: [phone]  [name] 02/28/2024, 9:35 AM

## 2024-02-29 LAB — COMPREHENSIVE METABOLIC PANEL WITH GFR
ALT: 121 U/L — ABNORMAL HIGH (ref 0–44)
AST: 242 U/L — ABNORMAL HIGH (ref 15–41)
Albumin: 3.2 g/dL — ABNORMAL LOW (ref 3.5–5.0)
Alkaline Phosphatase: 53 U/L (ref 38–126)
Anion gap: 9 (ref 5–15)
BUN: 10 mg/dL (ref 6–20)
CO2: 24 mmol/L (ref 22–32)
Calcium: 8.6 mg/dL — ABNORMAL LOW (ref 8.9–10.3)
Chloride: 107 mmol/L (ref 98–111)
Creatinine, Ser: 1.49 mg/dL — ABNORMAL HIGH (ref 0.61–1.24)
GFR, Estimated: 58 mL/min — ABNORMAL LOW
Glucose, Bld: 101 mg/dL — ABNORMAL HIGH (ref 70–99)
Potassium: 3.3 mmol/L — ABNORMAL LOW (ref 3.5–5.1)
Sodium: 140 mmol/L (ref 135–145)
Total Bilirubin: 1 mg/dL (ref 0.0–1.2)
Total Protein: 5.9 g/dL — ABNORMAL LOW (ref 6.5–8.1)

## 2024-02-29 LAB — CK: Total CK: 8250 U/L — ABNORMAL HIGH (ref 49–397)

## 2024-02-29 MED ORDER — MELATONIN 3 MG PO TABS
6.0000 mg | ORAL_TABLET | Freq: Every evening | ORAL | Status: DC | PRN
  Administered 2024-02-29: 6 mg via ORAL
  Filled 2024-02-29: qty 2

## 2024-02-29 MED ORDER — POTASSIUM CHLORIDE CRYS ER 20 MEQ PO TBCR
40.0000 meq | EXTENDED_RELEASE_TABLET | ORAL | Status: AC
  Administered 2024-02-29 (×2): 40 meq via ORAL
  Filled 2024-02-29 (×2): qty 2

## 2024-02-29 NOTE — Progress Notes (Signed)
 Occupational Therapy Treatment Patient Details Name: Gavin Bowen MRN: [id number] DOB: 10/04/1975 Today's Date: 02/29/2024   History of present illness Pt is 49 yo male presenting to [facility] on 2/2 due to being found on the floor at home. Pt found to have Rhabdomyolysis, AKI. PMH: no significant medical history   OT comments  Pt progressing incrementally in R UE ROM. Continues to be limited by numbness and tingling in R hand. Established exercise program for R wrist and hand. Instructed to continue self ROM of R shoulder. Pt returned demonstration of all exercises. Issued foam build ups for eating utensils and toothbrush. Continue to recommend OPOT.      If plan is discharge home, recommend the following:  A little help with bathing/dressing/bathroom;Assistance with cooking/housework   Equipment Recommendations  None recommended by OT    Recommendations for Other Services      Precautions / Restrictions Precautions Required Braces or Orthoses: Splint/Cast Splint/Cast: R wrist cock up Splint/Cast - Date Prophylactic Dressing Applied (if applicable): 02/27/24 Restrictions Weight Bearing Restrictions Per Provider Order: No       Mobility Bed Mobility               General bed mobility comments: in chair    Transfers Overall transfer level: Modified independent Equipment used: None                     Balance                                           ADL either performed or assessed with clinical judgement   ADL Overall ADL's : Needs assistance/impaired Eating/Feeding: Set up;Sitting Eating/Feeding Details (indicate cue type and reason): issued foam build up for eating utensil Grooming: Modified independent;Standing Grooming Details (indicate cue type and reason): provided and educated in use of foam build up for toothbrush                 Toilet Transfer: Modified Independent           Functional mobility during ADLs:  Modified independent      Extremity/Trunk Assessment              Vision       Perception     Praxis     Communication Communication Communication: No apparent difficulties   Cognition Arousal: Alert Behavior During Therapy: WFL for tasks assessed/performed Cognition: No apparent impairments                               Following commands: Intact        Cueing   Cueing Techniques: Verbal cues, Visual cues  Exercises Exercises: Other exercises Other Exercises Other Exercises: yellow theraputty exercises: rolling out into log, making a volcano, flattening Other Exercises: green foam block: gross grasp and 3 jaw chuck, cues to avoid wrist flexion Other Exercises: squirreling wash cloth with R hand flat on table    Shoulder Instructions       General Comments      Pertinent Vitals/ Pain       Pain Assessment Pain Assessment: Faces Faces Pain Scale: Hurts a little bit Pain Location: R hand Pain Descriptors / Indicators: Tingling Pain Intervention(s): Monitored during session, Repositioned  Home Living  Prior Functioning/Environment              Frequency  Min 3X/week        Progress Toward Goals  OT Goals(current goals can now be found in the care plan section)  Progress towards OT goals: Progressing toward goals  Acute Rehab OT Goals OT Goal Formulation: With patient Time For Goal Achievement: 03/13/24 Potential to Achieve Goals: Good  Plan      Co-evaluation                 AM-PAC OT 6 Clicks Daily Activity     Outcome Measure   Help from another person eating meals?: A Little Help from another person taking care of personal grooming?: A Little Help from another person toileting, which includes using toliet, bedpan, or urinal?: None Help from another person bathing (including washing, rinsing, drying)?: A Little Help from another person to put on  and taking off regular upper body clothing?: None Help from another person to put on and taking off regular lower body clothing?: A Little 6 Click Score: 20    End of Session    OT Visit Diagnosis: Muscle weakness (generalized) (M62.81);Pain   Activity Tolerance Patient tolerated treatment well   Patient Left in chair;with call bell/phone within reach   Nurse Communication          Time: 1152-1228 OT Time Calculation (min): 36 min  Charges: OT General Charges $OT Visit: 1 Visit OT Treatments $Self Care/Home Management : 8-22 mins $Therapeutic Activity: 8-22 mins  Gavin Bowen, OTR/L Acute Rehabilitation Services Office: [phone]   Gavin Bowen 02/29/2024, 12:46 PM

## 2024-02-29 NOTE — Progress Notes (Signed)
 " Progress Note   Patient: Gavin Bowen FMW:[id number] DOB: 1976/01/20 DOA: 02/26/2024     3 DOS: the patient was seen and examined on 02/29/2024   Brief hospital course: Mr. Lady is a 49 yo male with no pertinent PMH who presented after being found on the floor at home.  He had endorsed using ecstasy with his partner and apparently had passed out on the floor after she had left.  Unclear how long he was down but likely approximately 2 days based off the timeframe.  UDS was positive for cocaine and fentanyl  on admission. Further workup showed significantly elevated CK, greater than 20,000. Creatinine 1.9 on admission, BUN 21.  He was started on high rate fluids and admitted for further workup.  Assessment and Plan: Rhabdomyolysis -Likely multifactorial in etiology including drug use, crush injury while laying on R side, as well as aggressive upper body exercise workout prior to event - passed out after ectasy at home, but UDS noted with cocaine and fentanyl ; remorseful and plans to not do again - Initial CK elevated greater than 20,000, now down to 8250 -Continue fluids and trending CK daily   Syncope-resolved as of 02/27/2024 - Likely precipitated from drug use.  No prior history of syncope -Initial troponin negative; no significant signs of ischemia on EKG -Hold off on further workup given likely precipitated by drug use   AKI (acute kidney injury) - baseline creatinine ~ 0.8 - 1 - patient presents with increase in creat >0.3 mg/dL above baseline or creat increase >1.5x baseline presumed to have occurred within past 7 days PTA - creat 1.9 on admission - Suspecting prerenal for now but at risk for pigment nephropathy in setting of severe rhabdo - improving with hydration   Right arm weakness - Presumed due to rhabdomyolysis and potentially laying on right side while on the floor -No signs of compartment syndrome -MRI brain negative for stroke nor signs of PRES - place right wrist  splint; PT ordered - monitor for any signs of compartment syndrome or worsening muscle weakness -Recommend keeping arm elevated -Pt reports possible some improvement. Outpt OT planned   Hematoma - Right latissimus dorsi hematoma likely from being down on his right side on the ground - Continue supportive care and heat pack as needed -Trend CBC   Elevated LFTs - Likely from acute illness/congestion and/or rhabdomyolysis - Hepatitis panel negative.  INR normal - trend LFTs   Hypokalemia - Replete as needed   Subjective: Hoping to be able to discharge soon. Believes his R arm is less swollen   Physical Exam: Vitals:   02/28/24 2300 02/29/24 0350 02/29/24 0818 02/29/24 1518  BP: (!) 153/81 (!) 142/95 (!) 145/96 (!) 147/99  Pulse: 67 68 71 68  Resp: 19 16 16 16   Temp: 98.5 F (36.9 C) 98.4 F (36.9 C) 98.2 F (36.8 C) 98.1 F (36.7 C)  TempSrc: Oral  Oral Oral  SpO2: 98% 97% 97% 97%  Weight:      Height:       General exam: Conversant, in no acute distress Respiratory system: normal chest rise, clear, no audible wheezing Cardiovascular system: regular rhythm, s1-s2 Gastrointestinal system: Nondistended, nontender, pos BS Central nervous system: No seizures, no tremors Extremities: No cyanosis, no joint deformities Skin: No rashes, no pallor Psychiatry: Affect normal // no auditory hallucinations   Data Reviewed:  Labs reviewed: Na 140, K 3.3, Cr 1.49, AST 2432, ALT 121, CK 8250  Family Communication: Pt in room, pt's mother  over phone  Disposition: Status is: Inpatient Remains inpatient appropriate because: severity of illness  Planned Discharge Destination: Home    Author: Garnette Pelt, MD 02/29/2024 4:13 PM  For on call review [email or web address].  "

## 2024-03-01 ENCOUNTER — Telehealth (INDEPENDENT_AMBULATORY_CARE_PROVIDER_SITE_OTHER): Payer: Self-pay

## 2024-03-01 ENCOUNTER — Other Ambulatory Visit (HOSPITAL_COMMUNITY): Payer: Self-pay

## 2024-03-01 LAB — COMPREHENSIVE METABOLIC PANEL WITH GFR
ALT: 102 U/L — ABNORMAL HIGH (ref 0–44)
AST: 179 U/L — ABNORMAL HIGH (ref 15–41)
Albumin: 3.2 g/dL — ABNORMAL LOW (ref 3.5–5.0)
Alkaline Phosphatase: 48 U/L (ref 38–126)
Anion gap: 9 (ref 5–15)
BUN: 10 mg/dL (ref 6–20)
CO2: 24 mmol/L (ref 22–32)
Calcium: 8.5 mg/dL — ABNORMAL LOW (ref 8.9–10.3)
Chloride: 108 mmol/L (ref 98–111)
Creatinine, Ser: 1.37 mg/dL — ABNORMAL HIGH (ref 0.61–1.24)
GFR, Estimated: >60 mL/min
Glucose, Bld: 102 mg/dL — ABNORMAL HIGH (ref 70–99)
Potassium: 3.6 mmol/L (ref 3.5–5.1)
Sodium: 140 mmol/L (ref 135–145)
Total Bilirubin: 1 mg/dL (ref 0.0–1.2)
Total Protein: 5.8 g/dL — ABNORMAL LOW (ref 6.5–8.1)

## 2024-03-01 LAB — CK: Total CK: 5351 U/L — ABNORMAL HIGH (ref 49–397)

## 2024-03-01 MED ORDER — OXYCODONE HCL 5 MG PO TABS
5.0000 mg | ORAL_TABLET | Freq: Four times a day (QID) | ORAL | 0 refills | Status: AC | PRN
  Filled 2024-03-01: qty 20, 5d supply, fill #0

## 2024-03-01 NOTE — Progress Notes (Signed)
 Occupational Therapy Treatment Patient Details Name: Gavin Bowen MRN: [id number] DOB: [birth year] Today's Date: 03/01/2024   History of present illness Pt is 49 yo male presenting to [facility] on 2/2 due to being found on the floor at home. Pt found to have Rhabdomyolysis, AKI. PMH: no significant medical history   OT comments  Pt with improvement in edema in upper R arm. Continues to have tingling in hand, intact light touch/finger identification. Able to FF R shoulder actively to 90 degrees. Completed R wrist and hand exercises as detailed below. Pt eager to discharge home to care for his kids so his partner may return to work. Continue to recommend OPOT.      If plan is discharge home, recommend the following:  Assistance with cooking/housework   Equipment Recommendations  None recommended by OT    Recommendations for Other Services      Precautions / Restrictions Precautions Required Braces or Orthoses: Splint/Cast Splint/Cast: R wrist cock up Splint/Cast - Date Prophylactic Dressing Applied (if applicable): 02/27/24 Restrictions Weight Bearing Restrictions Per Provider Order: No       Mobility Bed Mobility Overal bed mobility: Modified Independent                  Transfers                         Balance Overall balance assessment: Independent                                         ADL either performed or assessed with clinical judgement   ADL                                         General ADL Comments: pt highly focused on need to go home and take care of his kids so his partner may go to work    [employer] Account Executive Communication: No apparent difficulties   Cognition Arousal: Alert Behavior During Therapy: WFL for tasks assessed/performed Cognition: No apparent impairments             OT - Cognition  Comments: eager to discharge, MD notified                 Following commands: Intact        Cueing   Cueing Techniques: Verbal cues  Exercises Other Exercises Other Exercises: yellow theraputty exercises: rolling out into log, making a volcano, flattening Other Exercises: green foam block: gross grasp and 3 jaw chuck, cues to avoid wrist flexion Other Exercises: A/AROM R shoulder 10 x 2 Other Exercises: wrist extension with finger isolation to tap    Shoulder Instructions       General Comments      Pertinent Vitals/ Pain       Pain Assessment Pain Assessment: Faces Faces Pain Scale: Hurts a little bit Pain Location: R hand Pain Descriptors / Indicators: Tingling Pain Intervention(s): Monitored during session  Home Living  Prior Functioning/Environment              Frequency  Min 3X/week        Progress Toward Goals  OT Goals(current goals can now be found in the care plan section)  Progress towards OT goals: Progressing toward goals  Acute Rehab OT Goals OT Goal Formulation: With patient Time For Goal Achievement: 03/13/24 Potential to Achieve Goals: Good  Plan      Co-evaluation                 AM-PAC OT 6 Clicks Daily Activity     Outcome Measure   Help from another person eating meals?: A Little Help from another person taking care of personal grooming?: None Help from another person toileting, which includes using toliet, bedpan, or urinal?: None Help from another person bathing (including washing, rinsing, drying)?: None Help from another person to put on and taking off regular upper body clothing?: None Help from another person to put on and taking off regular lower body clothing?: A Little 6 Click Score: 22    End of Session    OT Visit Diagnosis: Muscle weakness (generalized) (M62.81)   Activity Tolerance Patient tolerated treatment well   Patient Left in  bed;with call bell/phone within reach   Nurse Communication          Time: 8858-8841 OT Time Calculation (min): 17 min  Charges: OT General Charges $OT Visit: 1 Visit OT Treatments $Therapeutic Exercise: 8-22 mins  Mliss HERO, OTR/L Acute Rehabilitation Services Office: [phone]   [name] 03/01/2024, 1:07 PM

## 2024-03-01 NOTE — Discharge Summary (Signed)
 " Physician Discharge Summary   Patient: Gavin Bowen MRN: [id number] DOB: 1975/03/18  Admit date:     02/26/2024  Discharge date: 03/01/24  Discharge Physician: Garnette Pelt   PCP: Patient, No Pcp Per   Recommendations at discharge:    Follow up with PCP as scheduled  Discharge Diagnoses: Principal Problem:   Rhabdomyolysis Active Problems:   AKI (acute kidney injury)   Right arm weakness   Hypokalemia   Elevated LFTs   Hematoma  Resolved Problems:   Syncope  Hospital Course: Mr. Feutz is a 49 yo male with no pertinent PMH who presented after being found on the floor at home.  He had endorsed using ecstasy with his partner and apparently had passed out on the floor after she had left.  Unclear how long he was down but likely approximately 2 days based off the timeframe.  UDS was positive for cocaine and fentanyl  on admission. Further workup showed significantly elevated CK, greater than 20,000. Creatinine 1.9 on admission, BUN 21.  He was started on high rate fluids and admitted for further workup.  Assessment and Plan: Rhabdomyolysis - passed out after ectasy at home, but UDS noted with cocaine and fentanyl ; remorseful and plans to not do again - Initial CK elevated greater than 20,000, now down to 5k by time of dc -Continue fluids and trending CK daily   Syncope-resolved as of 02/27/2024 - Likely precipitated from drug use.  No prior history of syncope -Initial troponin negative; no significant signs of ischemia on EKG -Hold off on further workup given likely precipitated by drug use   AKI (acute kidney injury) - baseline creatinine ~ 0.8 - 1 - patient presents with increase in creat >0.3 mg/dL above baseline or creat increase >1.5x baseline presumed to have occurred within past 7 days PTA - creat 1.9 on admission - Suspecting prerenal for now but at risk for pigment nephropathy in setting of severe rhabdo - improved with hydration   Right arm weakness - Presumed  due to rhabdomyolysis and potentially laying on right side while on the floor -No signs of compartment syndrome -MRI brain negative for stroke nor signs of PRES -Recommend keeping arm elevated -swelling improving   Hematoma - Right latissimus dorsi hematoma likely from being down on his right side on the ground - Continue supportive care and heat pack as needed   Elevated LFTs - Likely from acute illness/congestion and/or rhabdomyolysis - Hepatitis panel negative.  INR normal - LFT's imrpoving   Hypokalemia - Replete as needed    Consultants:  Procedures performed:   Disposition: Home Diet recommendation:  Regular diet DISCHARGE MEDICATION: Allergies as of 03/01/2024   No Known Allergies      Medication List     TAKE these medications    oxyCODONE  5 MG immediate release tablet Commonly known as: Oxy IR/ROXICODONE  Take 1 tablet (5 mg total) by mouth every 6 (six) hours as needed for severe pain (pain score 7-10).        Follow-up Information     [facility] Follow up on 04/10/2024.   Specialty: Family Medicine Why: 2:50 pm with Rosaline Bohr NP Placed on waitlist for earlier appointment time Contact information: [address] [place] [facility] [place]  [address] [phone] Additional information: [address]  [place], [place] [address]        [facility] Follow up.   Specialty: Rehabilitation Why: outpatient OT referral made , please call and arrange appointment time Contact  information: [address] [place]  [address] [phone]               Discharge Exam: Gavin Bowen   02/26/24 1315 02/27/24 0400  Weight: 99.8 kg 98.9 kg   General exam: Awake, laying in bed, in nad Respiratory system: Normal respiratory effort, no wheezing Cardiovascular system: regular rate, s1, s2 Gastrointestinal system: Soft, nondistended, positive BS Central nervous  system: CN2-12 grossly intact, strength intact Extremities: Perfused, no clubbing Skin: Normal skin turgor, no notable skin lesions seen Psychiatry: Mood normal // no visual hallucinations   Condition at discharge: fair  The results of significant diagnostics from this hospitalization (including imaging, microbiology, ancillary and laboratory) are listed below for reference.   Imaging Studies: MR BRAIN WO CONTRAST Result Date: 02/27/2024 EXAM: MRI BRAIN WITHOUT CONTRAST 02/27/2024 01:33:00 AM TECHNIQUE: Multiplanar multisequence MRI of the head/brain was performed without the administration of intravenous contrast. COMPARISON: None available. CLINICAL HISTORY: Stroke suspected (Ped 0-17y); Stroke, follow up Stroke suspected (Pediatric 0-17 years); stroke, follow-up. FINDINGS: BRAIN AND VENTRICLES: No acute infarct. No intracranial hemorrhage. No mass. No midline shift. No hydrocephalus. The sella is unremarkable. Normal flow voids. ORBITS: No significant abnormality. SINUSES AND MASTOIDS: No significant abnormality. BONES AND SOFT TISSUES: Normal marrow signal. No soft tissue abnormality. IMPRESSION: 1. Normal brain MRI. Electronically signed by: Franky Stanford MD 02/27/2024 02:10 AM EST RP Workstation: [id number]   DG Foot Complete Left Result Date: 02/26/2024 CLINICAL DATA:  Status post fall. EXAM: LEFT FOOT - COMPLETE 3+ VIEW COMPARISON:  None Available. FINDINGS: There is no evidence of fracture or dislocation. There is no evidence of arthropathy or other focal bone abnormality. Soft tissues are unremarkable. IMPRESSION: Negative. Electronically Signed   By: Suzen Dials M.D.   On: 02/26/2024 17:54   CT ABDOMEN PELVIS WO CONTRAST Result Date: 02/26/2024 CLINICAL DATA:  Elevated liver function tests, fell yesterday EXAM: CT ABDOMEN AND PELVIS WITHOUT CONTRAST TECHNIQUE: Multidetector CT imaging of the abdomen and pelvis was performed following the standard protocol without IV contrast. Unenhanced  CT was performed per clinician order. Lack of IV contrast limits sensitivity and specificity, especially for evaluation of abdominal/pelvic solid viscera. RADIATION DOSE REDUCTION: This exam was performed according to the departmental dose-optimization program which includes automated exposure control, adjustment of the mA and/or kV according to patient size and/or use of iterative reconstruction technique. COMPARISON:  10/13/2020, 01/28/2023 FINDINGS: Lower chest: Bibasilar hypoventilatory changes. No acute pleural or parenchymal lung disease. Hepatobiliary: Unremarkable unenhanced appearance of the liver and gallbladder. No biliary duct dilation. Pancreas: Unremarkable unenhanced appearance. Spleen: Unremarkable unenhanced appearance. Adrenals/Urinary Tract: Residual contrast within the kidneys, ureters, and bladder from preceding CT angiography exam. No evidence of obstructive uropathy. The adrenals, kidneys, and bladder are grossly unremarkable. Stomach/Bowel: No bowel obstruction or ileus. Normal appendix right lower quadrant. No bowel wall thickening or inflammatory change. Vascular/Lymphatic: No significant vascular findings are present. No enlarged abdominal or pelvic lymph nodes. Reproductive: Prostate is unremarkable. Other: No free fluid or free intraperitoneal gas. No abdominal wall hernia. Musculoskeletal: There is asymmetric enlargement of the right posterolateral chest wall musculature, primarily the latissimus dorsi, consistent with intramuscular hematoma given recent trauma. Mild subcutaneous fat stranding within the right flank and right posterolateral chest wall. No acute or destructive bony abnormalities. There is chronic anterior wedging of the T12 vertebral body, with estimated 50% loss of vertebral body height. Reconstructed images demonstrate no additional findings. IMPRESSION: 1. Asymmetric enlargement of the right latissimus dorsi muscle, with surrounding  fat stranding, consistent with  intramuscular hematoma given history of recent trauma. 2. Otherwise no acute intrathoracic or intra-abdominal process identified on this unenhanced exam. Electronically Signed   By: Ozell Daring M.D.   On: 02/26/2024 17:38   CT Cervical Spine Wo Contrast Result Date: 02/26/2024 CLINICAL DATA:  Fall, neck pain EXAM: CT CERVICAL SPINE WITHOUT CONTRAST TECHNIQUE: Multidetector CT imaging of the cervical spine was performed without intravenous contrast. Multiplanar CT image reconstructions were also generated. RADIATION DOSE REDUCTION: This exam was performed according to the departmental dose-optimization program which includes automated exposure control, adjustment of the mA and/or kV according to patient size and/or use of iterative reconstruction technique. COMPARISON:  None Available. FINDINGS: The craniocervical junction is normal. Atlantoaxial articulation is normal. No bone lesion identified. No significant soft tissue abnormality identified. C2-C3: Normal C3-C4: Normal C4-C5: Normal C5-C6: Normal C6-C7: Normal C7-T1: Normal IMPRESSION: Normal Electronically Signed   By: Nancyann Burns M.D.   On: 02/26/2024 16:38   CT ANGIO HEAD NECK W WO CM Result Date: 02/26/2024 CLINICAL DATA:  Possible seizure, fall EXAM: CT ANGIOGRAPHY HEAD AND NECK WITH AND WITHOUT CONTRAST TECHNIQUE: Multidetector CT imaging of the head and neck was performed using the standard protocol during bolus administration of intravenous contrast. Multiplanar CT image reconstructions and MIPs were obtained to evaluate the vascular anatomy. Carotid stenosis measurements (when applicable) are obtained utilizing NASCET criteria, using the distal internal carotid diameter as the denominator. RADIATION DOSE REDUCTION: This exam was performed according to the departmental dose-optimization program which includes automated exposure control, adjustment of the mA and/or kV according to patient size and/or use of iterative reconstruction technique.  CONTRAST:  75mL OMNIPAQUE  IOHEXOL  350 MG/ML SOLN COMPARISON:  None Available. FINDINGS: CT HEAD: Attenuation in the brain parenchyma is normal There is no hemorrhage. No acute ischemic changes. No mass lesion. The ventricles are normal. Skull/sinuses/orbits: No significant abnormality. CTA NECK: CTA NECK Aortic arch: No proximal vessel stenosis. Right carotid: Normal Left carotid: Normal Right vertebral: Normal Left vertebral: Normal Soft tissues: No significant abnormality Other comments: None CTA HEAD: CTA HEAD Right anterior circulation: The internal carotid artery is patent without significant stenosis. The anterior and middle cerebral arteries are patent without significant stenosis or proximal branch occlusion. No aneurysm. Left anterior circulation: The internal carotid artery is patent without significant stenosis. The anterior and middle cerebral arteries are patent without significant stenosis or proximal branch occlusion. No aneurysm. Posterior circulation: Both vertebral arteries are patent. There is no significant basilar stenosis. Both posterior cerebral arteries are patent without significant stenosis or proximal branch occlusion. No aneurysm. IMPRESSION: Normal Electronically Signed   By: Nancyann Burns M.D.   On: 02/26/2024 16:37   DG Chest Port 1 View Result Date: 02/26/2024 CLINICAL DATA:  Syncope. EXAM: PORTABLE CHEST - 1 VIEW COMPARISON:  01/28/2023 FINDINGS: Cardiomediastinal silhouette and pulmonary vasculature are within normal limits. Lungs are clear. IMPRESSION: No acute cardiopulmonary process. Electronically Signed   By: Aliene Lloyd M.D.   On: 02/26/2024 14:32   DG Shoulder Right Portable Result Date: 02/26/2024 CLINICAL DATA:  Syncope and fall. RIGHT shoulder pain extending down to digits. EXAM: RIGHT SHOULDER - 1 VIEW COMPARISON:  10/13/2020 FINDINGS: Deformity of the RIGHT proximal humerus is consistent with prior healed fracture. Mild degenerative changes of the RIGHT  acromioclavicular joint. IMPRESSION: No acute abnormality of the RIGHT shoulder. Electronically Signed   By: Aliene Lloyd M.D.   On: 02/26/2024 14:30    Microbiology: Results for orders placed or performed  during the hospital encounter of 10/13/20  Resp Panel by RT-PCR (Flu A&B, Covid) Nasopharyngeal Swab     Status: None   Collection Time: 10/13/20 12:17 PM   Specimen: Nasopharyngeal Swab; Nasopharyngeal(NP) swabs in vial transport medium  Result Value Ref Range Status   SARS Coronavirus 2 by RT PCR NEGATIVE NEGATIVE Final    Comment: (NOTE) SARS-CoV-2 target nucleic acids are NOT DETECTED.  The SARS-CoV-2 RNA is generally detectable in upper respiratory specimens during the acute phase of infection. The lowest concentration of SARS-CoV-2 viral copies this assay can detect is 138 copies/mL. A negative result does not preclude SARS-Cov-2 infection and should not be used as the sole basis for treatment or other patient management decisions. A negative result may occur with  improper specimen collection/handling, submission of specimen other than nasopharyngeal swab, presence of viral mutation(s) within the areas targeted by this assay, and inadequate number of viral copies(<138 copies/mL). A negative result must be combined with clinical observations, patient history, and epidemiological information. The expected result is Negative.  Fact Sheet for Patients:  bloggercourse.com  Fact Sheet for Healthcare Providers:  seriousbroker.it  This test is no t yet approved or cleared by the [place]  FDA and  has been authorized for detection and/or diagnosis of SARS-CoV-2 by FDA under an Emergency Use Authorization (EUA). This EUA will remain  in effect (meaning this test can be used) for the duration of the COVID-19 declaration under Section 564(b)(1) of the Act, 21 U.S.C.section 360bbb-3(b)(1), unless the authorization is terminated   or revoked sooner.       Influenza A by PCR NEGATIVE NEGATIVE Final   Influenza B by PCR NEGATIVE NEGATIVE Final    Comment: (NOTE) The Xpert Xpress SARS-CoV-2/FLU/RSV plus assay is intended as an aid in the diagnosis of influenza from Nasopharyngeal swab specimens and should not be used as a sole basis for treatment. Nasal washings and aspirates are unacceptable for Xpert Xpress SARS-CoV-2/FLU/RSV testing.  Fact Sheet for Patients: bloggercourse.com  Fact Sheet for Healthcare Providers: seriousbroker.it  This test is not yet approved or cleared by the [place]  FDA and has been authorized for detection and/or diagnosis of SARS-CoV-2 by FDA under an Emergency Use Authorization (EUA). This EUA will remain in effect (meaning this test can be used) for the duration of the COVID-19 declaration under Section 564(b)(1) of the Act, 21 U.S.C. section 360bbb-3(b)(1), unless the authorization is terminated or revoked.  Performed at [facility], [address] [place], [place] [address]     Labs: CBC: Recent Labs  Lab 02/26/24 1347 02/27/24 0529 02/28/24 0513  WBC 11.2* 9.2 8.4  NEUTROABS 8.3*  --  4.9  HGB 15.5 14.8 14.1  HCT 41.4 39.6 38.0*  MCV 83.8 83.5 83.5  PLT 251 254 272   Basic Metabolic Panel: Recent Labs  Lab 02/26/24 1347 02/27/24 0529 02/28/24 0513 02/29/24 0358 03/01/24 0618  NA 135 137 140 140 140  K 3.8 3.4* 3.4* 3.3* 3.6  CL 95* 101 105 107 108  CO2 29 26 25 24 24   GLUCOSE 116* 130* 113* 101* 102*  BUN 21* 15 11 10 10   CREATININE 1.90* 1.65* 1.51* 1.49* 1.37*  CALCIUM 9.5 8.6* 8.6* 8.6* 8.5*  MG 2.8*  --  2.1  --   --    Liver Function Tests: Recent Labs  Lab 02/26/24 1347 02/27/24 0529 02/28/24 0513 02/29/24 0358 03/01/24 0618  AST 766* 493* 327* 242* 179*  ALT 264* 205* 153* 121* 102*  ALKPHOS  77 67 58 53 48  BILITOT 1.3* 1.1 1.2 1.0 1.0  PROT 7.9 6.8 6.3*  5.9* 5.8*  ALBUMIN 4.1 3.6 3.4* 3.2* 3.2*   CBG: No results for input(s): GLUCAP in the last 168 hours.  Discharge time spent: less than 30 minutes.  Signed: Garnette Pelt, MD [facility] 03/01/2024 "

## 2024-03-01 NOTE — Telephone Encounter (Signed)
 Copied from CRM #[id number]. Topic: Appointments - Appointment Scheduling >> Mar 01, 2024  1:32 PM Emylou G wrote: New Patient ( hosp f/u mylosis ) -- set up appt.. but can he be seen sooner for hosp discharge today

## 2024-03-01 NOTE — Plan of Care (Signed)

## 2024-03-01 NOTE — Plan of Care (Signed)
" °  Problem: Education: Goal: Knowledge of General Education information will improve Description: Including pain rating scale, medication(s)/side effects and non-pharmacologic comfort measures 03/01/2024 1424 by Delores Kirsch, RN Outcome: Adequate for Discharge 03/01/2024 1329 by Delores Kirsch, RN Outcome: Progressing   Problem: Health Behavior/Discharge Planning: Goal: Ability to manage health-related needs will improve 03/01/2024 1424 by Delores Kirsch, RN Outcome: Adequate for Discharge 03/01/2024 1329 by Delores Kirsch, RN Outcome: Progressing   Problem: Clinical Measurements: Goal: Ability to maintain clinical measurements within normal limits will improve 03/01/2024 1424 by Delores Kirsch, RN Outcome: Adequate for Discharge 03/01/2024 1329 by Delores Kirsch, RN Outcome: Progressing Goal: Will remain free from infection Outcome: Adequate for Discharge Goal: Diagnostic test results will improve Outcome: Adequate for Discharge Goal: Respiratory complications will improve Outcome: Adequate for Discharge Goal: Cardiovascular complication will be avoided Outcome: Adequate for Discharge   Problem: Activity: Goal: Risk for activity intolerance will decrease Outcome: Adequate for Discharge   Problem: Nutrition: Goal: Adequate nutrition will be maintained Outcome: Adequate for Discharge   Problem: Coping: Goal: Level of anxiety will decrease Outcome: Adequate for Discharge   Problem: Elimination: Goal: Will not experience complications related to bowel motility Outcome: Adequate for Discharge Goal: Will not experience complications related to urinary retention Outcome: Adequate for Discharge   Problem: Pain Managment: Goal: General experience of comfort will improve and/or be controlled Outcome: Adequate for Discharge   Problem: Safety: Goal: Ability to remain free from injury will improve Outcome: Adequate for Discharge   Problem: Skin Integrity: Goal: Risk for impaired  skin integrity will decrease Outcome: Adequate for Discharge   "

## 2024-03-01 NOTE — Progress Notes (Signed)
 Discharge Nurse Summary: DC order noted per MD. DC RN at bedside with patient. Patient agreeable with discharge plan, states family will arrive soon for pickup. AVS printed/reviewed. Discussed importance of cessation of substance abuse with discharge instructions. PIV removed, skin intact. No DME needs. No home meds. TOC meds delivered to the patient. CP/Edu resolved. Telemonitor not present on assessment. All belongings accounted for. Patient wheeled downstairs for discharge by private auto.   Rosario EMERSON Lund, RN

## 2024-03-01 NOTE — Telephone Encounter (Signed)
 Pt is currently admitted.

## 2024-03-01 NOTE — Progress Notes (Signed)
 Gave TOC meds to patient including oxycodone .

## 2024-03-26 ENCOUNTER — Ambulatory Visit: Admitting: Internal Medicine

## 2024-04-10 ENCOUNTER — Ambulatory Visit (INDEPENDENT_AMBULATORY_CARE_PROVIDER_SITE_OTHER): Payer: Self-pay | Admitting: Primary Care
